# Patient Record
Sex: Female | Born: 1965
Health system: Southern US, Community
[De-identification: ages and names within clinical notes are randomized; demographics above are authoritative.]

## PROBLEM LIST (undated history)

## (undated) DIAGNOSIS — B20 Human immunodeficiency virus [HIV] disease: Secondary | ICD-10-CM

## (undated) DIAGNOSIS — Z21 Asymptomatic human immunodeficiency virus [HIV] infection status: Secondary | ICD-10-CM

## (undated) HISTORY — DX: Asymptomatic human immunodeficiency virus (hiv) infection status: Z21

## (undated) HISTORY — DX: Human immunodeficiency virus (HIV) disease: B20

---

## 2009-11-04 ENCOUNTER — Ambulatory Visit: Payer: Self-pay | Admitting: Infectious Diseases

## 2010-02-17 ENCOUNTER — Ambulatory Visit: Payer: Self-pay | Admitting: Infectious Diseases

## 2010-04-21 ENCOUNTER — Ambulatory Visit: Payer: Self-pay | Admitting: Infectious Diseases

## 2010-07-07 ENCOUNTER — Ambulatory Visit: Payer: Self-pay | Admitting: Infectious Disease

## 2012-06-20 DIAGNOSIS — B20 Human immunodeficiency virus [HIV] disease: Secondary | ICD-10-CM

## 2012-09-11 DIAGNOSIS — B20 Human immunodeficiency virus [HIV] disease: Secondary | ICD-10-CM

## 2012-12-19 DIAGNOSIS — B20 Human immunodeficiency virus [HIV] disease: Secondary | ICD-10-CM

## 2013-05-12 DIAGNOSIS — B20 Human immunodeficiency virus [HIV] disease: Secondary | ICD-10-CM

## 2013-06-17 DIAGNOSIS — B20 Human immunodeficiency virus [HIV] disease: Secondary | ICD-10-CM

## 2014-10-14 DIAGNOSIS — B20 Human immunodeficiency virus [HIV] disease: Secondary | ICD-10-CM

## 2014-11-06 DIAGNOSIS — B20 Human immunodeficiency virus [HIV] disease: Secondary | ICD-10-CM

## 2014-12-17 DIAGNOSIS — J449 Chronic obstructive pulmonary disease, unspecified: Secondary | ICD-10-CM | POA: Diagnosis not present

## 2014-12-23 DIAGNOSIS — B20 Human immunodeficiency virus [HIV] disease: Secondary | ICD-10-CM | POA: Diagnosis not present

## 2014-12-24 DIAGNOSIS — R52 Pain, unspecified: Secondary | ICD-10-CM | POA: Diagnosis not present

## 2014-12-24 DIAGNOSIS — M545 Low back pain: Secondary | ICD-10-CM | POA: Diagnosis not present

## 2014-12-28 ENCOUNTER — Telehealth: Payer: Self-pay | Admitting: *Deleted

## 2014-12-28 NOTE — Telephone Encounter (Signed)
Attempt to call patient to have her come in for Hep C labs; voice mail not set up. Otila Kluver at [facility] notified. Myrtis Hopping

## 2015-02-04 DIAGNOSIS — B20 Human immunodeficiency virus [HIV] disease: Secondary | ICD-10-CM | POA: Diagnosis not present

## 2015-03-19 DIAGNOSIS — B20 Human immunodeficiency virus [HIV] disease: Secondary | ICD-10-CM | POA: Diagnosis not present

## 2015-05-10 DIAGNOSIS — M5416 Radiculopathy, lumbar region: Secondary | ICD-10-CM | POA: Diagnosis not present

## 2015-05-28 ENCOUNTER — Other Ambulatory Visit: Payer: Self-pay | Admitting: Infectious Disease

## 2015-06-16 DIAGNOSIS — B20 Human immunodeficiency virus [HIV] disease: Secondary | ICD-10-CM | POA: Diagnosis not present

## 2015-06-23 ENCOUNTER — Other Ambulatory Visit: Payer: Self-pay | Admitting: Infectious Disease

## 2015-08-05 ENCOUNTER — Other Ambulatory Visit: Payer: Self-pay | Admitting: Pharmacist Clinician (PhC)/ Clinical Pharmacy Specialist

## 2015-08-05 MED ORDER — LEDIPASVIR-SOFOSBUVIR 90-400 MG PO TABS: 1.0000 | ORAL_TABLET | Freq: Every day | ORAL | Status: DC

## 2015-08-05 MED ORDER — RIBAVIRIN 200 MG PO CAPS: 600.0000 mg | ORAL_CAPSULE | Freq: Every day | ORAL | Status: DC

## 2015-08-06 DIAGNOSIS — B20 Human immunodeficiency virus [HIV] disease: Secondary | ICD-10-CM | POA: Diagnosis not present

## 2015-08-12 ENCOUNTER — Encounter: Payer: Self-pay | Admitting: Pharmacy Technician

## 2015-09-09 ENCOUNTER — Other Ambulatory Visit: Payer: Self-pay | Admitting: Pharmacist Clinician (PhC)/ Clinical Pharmacy Specialist

## 2015-09-09 MED ORDER — ONDANSETRON HCL 4 MG PO TABS: 4.0000 mg | ORAL_TABLET | Freq: Three times a day (TID) | ORAL | Status: DC | PRN

## 2015-09-09 NOTE — Progress Notes (Signed)
Sounds good thanks [place]!

## 2015-09-09 NOTE — Progress Notes (Signed)
Christine Stewart called about refill for one of her meds which I think it's the ribavirin portion of her hepatitis C regimen since we are giving it to her for free. She also complained of N/V which could be due to the ribavirin. She has not missed doses. Since she has phenergan on board, I'm going to add PRN zofran to it and told her how to take it.   Zofran 4mg  PO q8h PRN

## 2015-09-21 DIAGNOSIS — B20 Human immunodeficiency virus [HIV] disease: Secondary | ICD-10-CM | POA: Diagnosis not present

## 2015-10-05 MED FILL — *HARVONI 90-400 MG TABLET: 90-400 | 28 days supply | Qty: 28 | Fill #2

## 2015-10-08 MED FILL — RIBAVIRIN 200 MG CAPSULE: 200 | 28 days supply | Qty: 84 | Fill #2

## 2015-11-10 DIAGNOSIS — B20 Human immunodeficiency virus [HIV] disease: Secondary | ICD-10-CM | POA: Diagnosis not present

## 2016-01-14 DIAGNOSIS — B20 Human immunodeficiency virus [HIV] disease: Secondary | ICD-10-CM | POA: Diagnosis not present

## 2016-03-29 ENCOUNTER — Other Ambulatory Visit: Payer: Self-pay | Admitting: Sports Medicine

## 2016-03-29 DIAGNOSIS — M5136 Other intervertebral disc degeneration, lumbar region: Secondary | ICD-10-CM | POA: Diagnosis not present

## 2016-03-29 DIAGNOSIS — M5410 Radiculopathy, site unspecified: Secondary | ICD-10-CM

## 2016-03-29 DIAGNOSIS — M25551 Pain in right hip: Secondary | ICD-10-CM | POA: Diagnosis not present

## 2016-03-29 DIAGNOSIS — M545 Low back pain: Secondary | ICD-10-CM

## 2016-04-06 ENCOUNTER — Inpatient Hospital Stay: Admission: RE | Admit: 2016-04-06 | Payer: Medicare Other | Source: Ambulatory Visit

## 2016-04-06 DIAGNOSIS — M545 Low back pain: Secondary | ICD-10-CM | POA: Diagnosis not present

## 2016-04-06 DIAGNOSIS — M419 Scoliosis, unspecified: Secondary | ICD-10-CM | POA: Diagnosis not present

## 2016-04-06 DIAGNOSIS — M791 Myalgia: Secondary | ICD-10-CM | POA: Diagnosis not present

## 2016-04-06 DIAGNOSIS — M47896 Other spondylosis, lumbar region: Secondary | ICD-10-CM | POA: Diagnosis not present

## 2016-04-06 DIAGNOSIS — M549 Dorsalgia, unspecified: Secondary | ICD-10-CM | POA: Diagnosis not present

## 2016-04-07 ENCOUNTER — Other Ambulatory Visit (HOSPITAL_BASED_OUTPATIENT_CLINIC_OR_DEPARTMENT_OTHER): Payer: Self-pay | Admitting: Orthopedic Surgery

## 2016-04-07 DIAGNOSIS — M545 Low back pain: Secondary | ICD-10-CM

## 2016-04-07 DIAGNOSIS — M47896 Other spondylosis, lumbar region: Secondary | ICD-10-CM

## 2016-04-16 ENCOUNTER — Other Ambulatory Visit (HOSPITAL_BASED_OUTPATIENT_CLINIC_OR_DEPARTMENT_OTHER): Payer: Medicare Other

## 2016-04-16 ENCOUNTER — Ambulatory Visit (HOSPITAL_BASED_OUTPATIENT_CLINIC_OR_DEPARTMENT_OTHER)
Admission: RE | Admit: 2016-04-16 | Discharge: 2016-04-16 | Disposition: A | Discharge status: Home / Self Care | Payer: Medicare Other | Source: Ambulatory Visit | Attending: Orthopedic Surgery | Admitting: Orthopedic Surgery

## 2016-04-16 DIAGNOSIS — M5126 Other intervertebral disc displacement, lumbar region: Secondary | ICD-10-CM | POA: Insufficient documentation

## 2016-04-16 DIAGNOSIS — M545 Low back pain: Secondary | ICD-10-CM | POA: Diagnosis present

## 2016-04-16 DIAGNOSIS — M47896 Other spondylosis, lumbar region: Secondary | ICD-10-CM

## 2016-04-20 DIAGNOSIS — M47816 Spondylosis without myelopathy or radiculopathy, lumbar region: Secondary | ICD-10-CM | POA: Diagnosis not present

## 2016-04-20 DIAGNOSIS — M549 Dorsalgia, unspecified: Secondary | ICD-10-CM | POA: Diagnosis not present

## 2016-04-20 DIAGNOSIS — M47896 Other spondylosis, lumbar region: Secondary | ICD-10-CM | POA: Diagnosis not present

## 2016-05-24 DIAGNOSIS — F331 Major depressive disorder, recurrent, moderate: Secondary | ICD-10-CM | POA: Diagnosis not present

## 2016-05-24 DIAGNOSIS — F411 Generalized anxiety disorder: Secondary | ICD-10-CM | POA: Diagnosis not present

## 2016-06-07 DIAGNOSIS — F411 Generalized anxiety disorder: Secondary | ICD-10-CM | POA: Diagnosis not present

## 2016-06-07 DIAGNOSIS — F331 Major depressive disorder, recurrent, moderate: Secondary | ICD-10-CM | POA: Diagnosis not present

## 2016-06-27 DIAGNOSIS — F331 Major depressive disorder, recurrent, moderate: Secondary | ICD-10-CM | POA: Diagnosis not present

## 2016-06-27 DIAGNOSIS — B20 Human immunodeficiency virus [HIV] disease: Secondary | ICD-10-CM | POA: Diagnosis not present

## 2016-06-27 DIAGNOSIS — F6381 Intermittent explosive disorder: Secondary | ICD-10-CM | POA: Diagnosis not present

## 2016-06-27 DIAGNOSIS — Z79899 Other long term (current) drug therapy: Secondary | ICD-10-CM | POA: Diagnosis not present

## 2016-07-11 DIAGNOSIS — F330 Major depressive disorder, recurrent, mild: Secondary | ICD-10-CM | POA: Diagnosis not present

## 2016-08-01 DIAGNOSIS — N898 Other specified noninflammatory disorders of vagina: Secondary | ICD-10-CM | POA: Diagnosis not present

## 2016-08-01 DIAGNOSIS — R102 Pelvic and perineal pain: Secondary | ICD-10-CM | POA: Diagnosis not present

## 2016-08-01 DIAGNOSIS — R87810 Cervical high risk human papillomavirus (HPV) DNA test positive: Secondary | ICD-10-CM | POA: Diagnosis not present

## 2016-08-03 DIAGNOSIS — F418 Other specified anxiety disorders: Secondary | ICD-10-CM | POA: Diagnosis not present

## 2016-08-03 DIAGNOSIS — B20 Human immunodeficiency virus [HIV] disease: Secondary | ICD-10-CM | POA: Diagnosis not present

## 2016-08-03 DIAGNOSIS — B182 Chronic viral hepatitis C: Secondary | ICD-10-CM | POA: Diagnosis not present

## 2016-08-03 DIAGNOSIS — Z79899 Other long term (current) drug therapy: Secondary | ICD-10-CM | POA: Diagnosis not present

## 2016-08-03 DIAGNOSIS — Z23 Encounter for immunization: Secondary | ICD-10-CM | POA: Diagnosis not present

## 2016-08-03 DIAGNOSIS — R112 Nausea with vomiting, unspecified: Secondary | ICD-10-CM | POA: Diagnosis not present

## 2016-08-03 DIAGNOSIS — F17210 Nicotine dependence, cigarettes, uncomplicated: Secondary | ICD-10-CM | POA: Diagnosis not present

## 2016-08-03 DIAGNOSIS — I10 Essential (primary) hypertension: Secondary | ICD-10-CM | POA: Diagnosis not present

## 2016-08-09 DIAGNOSIS — R1030 Lower abdominal pain, unspecified: Secondary | ICD-10-CM | POA: Diagnosis not present

## 2016-08-09 DIAGNOSIS — R102 Pelvic and perineal pain: Secondary | ICD-10-CM | POA: Diagnosis not present

## 2016-08-11 DIAGNOSIS — B20 Human immunodeficiency virus [HIV] disease: Secondary | ICD-10-CM | POA: Diagnosis not present

## 2016-08-16 DIAGNOSIS — F411 Generalized anxiety disorder: Secondary | ICD-10-CM | POA: Diagnosis not present

## 2016-08-16 DIAGNOSIS — F331 Major depressive disorder, recurrent, moderate: Secondary | ICD-10-CM | POA: Diagnosis not present

## 2016-08-21 DIAGNOSIS — R102 Pelvic and perineal pain: Secondary | ICD-10-CM | POA: Diagnosis not present

## 2016-08-21 DIAGNOSIS — N719 Inflammatory disease of uterus, unspecified: Secondary | ICD-10-CM | POA: Diagnosis not present

## 2016-08-21 DIAGNOSIS — B9689 Other specified bacterial agents as the cause of diseases classified elsewhere: Secondary | ICD-10-CM | POA: Diagnosis not present

## 2016-08-21 DIAGNOSIS — N760 Acute vaginitis: Secondary | ICD-10-CM | POA: Diagnosis not present

## 2016-09-12 DIAGNOSIS — N72 Inflammatory disease of cervix uteri: Secondary | ICD-10-CM | POA: Diagnosis not present

## 2016-09-12 DIAGNOSIS — Z3202 Encounter for pregnancy test, result negative: Secondary | ICD-10-CM | POA: Diagnosis not present

## 2016-09-12 DIAGNOSIS — B977 Papillomavirus as the cause of diseases classified elsewhere: Secondary | ICD-10-CM | POA: Diagnosis not present

## 2016-09-14 DIAGNOSIS — F330 Major depressive disorder, recurrent, mild: Secondary | ICD-10-CM | POA: Diagnosis not present

## 2016-09-14 DIAGNOSIS — F411 Generalized anxiety disorder: Secondary | ICD-10-CM | POA: Diagnosis not present

## 2016-10-24 DIAGNOSIS — Z79899 Other long term (current) drug therapy: Secondary | ICD-10-CM | POA: Diagnosis not present

## 2016-10-24 DIAGNOSIS — B20 Human immunodeficiency virus [HIV] disease: Secondary | ICD-10-CM | POA: Diagnosis not present

## 2016-10-24 DIAGNOSIS — F330 Major depressive disorder, recurrent, mild: Secondary | ICD-10-CM | POA: Diagnosis not present

## 2016-10-24 DIAGNOSIS — B182 Chronic viral hepatitis C: Secondary | ICD-10-CM | POA: Diagnosis not present

## 2017-04-06 DIAGNOSIS — B182 Chronic viral hepatitis C: Secondary | ICD-10-CM | POA: Diagnosis not present

## 2017-04-06 DIAGNOSIS — Z79899 Other long term (current) drug therapy: Secondary | ICD-10-CM | POA: Diagnosis not present

## 2017-04-06 DIAGNOSIS — B20 Human immunodeficiency virus [HIV] disease: Secondary | ICD-10-CM | POA: Diagnosis not present

## 2017-07-05 DIAGNOSIS — D2372 Other benign neoplasm of skin of left lower limb, including hip: Secondary | ICD-10-CM | POA: Diagnosis not present

## 2017-07-05 DIAGNOSIS — N200 Calculus of kidney: Secondary | ICD-10-CM | POA: Diagnosis not present

## 2017-07-05 DIAGNOSIS — B20 Human immunodeficiency virus [HIV] disease: Secondary | ICD-10-CM | POA: Diagnosis not present

## 2017-07-05 DIAGNOSIS — F17200 Nicotine dependence, unspecified, uncomplicated: Secondary | ICD-10-CM | POA: Diagnosis not present

## 2017-07-05 DIAGNOSIS — Z8619 Personal history of other infectious and parasitic diseases: Secondary | ICD-10-CM | POA: Diagnosis not present

## 2017-07-05 DIAGNOSIS — I10 Essential (primary) hypertension: Secondary | ICD-10-CM | POA: Diagnosis not present

## 2017-07-05 DIAGNOSIS — Z23 Encounter for immunization: Secondary | ICD-10-CM | POA: Diagnosis not present

## 2017-07-05 DIAGNOSIS — F329 Major depressive disorder, single episode, unspecified: Secondary | ICD-10-CM | POA: Diagnosis not present

## 2017-07-19 DIAGNOSIS — L723 Sebaceous cyst: Secondary | ICD-10-CM | POA: Diagnosis not present

## 2017-07-23 DIAGNOSIS — Z8619 Personal history of other infectious and parasitic diseases: Secondary | ICD-10-CM | POA: Diagnosis not present

## 2017-07-23 DIAGNOSIS — L720 Epidermal cyst: Secondary | ICD-10-CM | POA: Diagnosis not present

## 2017-07-23 DIAGNOSIS — L723 Sebaceous cyst: Secondary | ICD-10-CM | POA: Diagnosis not present

## 2017-07-23 DIAGNOSIS — F17200 Nicotine dependence, unspecified, uncomplicated: Secondary | ICD-10-CM | POA: Diagnosis not present

## 2017-07-23 DIAGNOSIS — Z79899 Other long term (current) drug therapy: Secondary | ICD-10-CM | POA: Diagnosis not present

## 2017-10-18 DIAGNOSIS — B20 Human immunodeficiency virus [HIV] disease: Secondary | ICD-10-CM | POA: Diagnosis not present

## 2017-11-15 DIAGNOSIS — R112 Nausea with vomiting, unspecified: Secondary | ICD-10-CM | POA: Diagnosis not present

## 2017-11-15 DIAGNOSIS — J449 Chronic obstructive pulmonary disease, unspecified: Secondary | ICD-10-CM | POA: Diagnosis not present

## 2017-11-15 DIAGNOSIS — R1013 Epigastric pain: Secondary | ICD-10-CM | POA: Diagnosis not present

## 2017-11-15 DIAGNOSIS — R1012 Left upper quadrant pain: Secondary | ICD-10-CM | POA: Diagnosis not present

## 2017-11-15 DIAGNOSIS — F17210 Nicotine dependence, cigarettes, uncomplicated: Secondary | ICD-10-CM | POA: Diagnosis not present

## 2017-11-21 DIAGNOSIS — K219 Gastro-esophageal reflux disease without esophagitis: Secondary | ICD-10-CM | POA: Diagnosis not present

## 2017-11-21 DIAGNOSIS — R1011 Right upper quadrant pain: Secondary | ICD-10-CM | POA: Diagnosis not present

## 2017-11-21 DIAGNOSIS — R1013 Epigastric pain: Secondary | ICD-10-CM | POA: Diagnosis not present

## 2017-11-22 DIAGNOSIS — R110 Nausea: Secondary | ICD-10-CM | POA: Diagnosis not present

## 2017-11-22 DIAGNOSIS — Z79899 Other long term (current) drug therapy: Secondary | ICD-10-CM | POA: Diagnosis not present

## 2017-11-22 DIAGNOSIS — Z8619 Personal history of other infectious and parasitic diseases: Secondary | ICD-10-CM | POA: Diagnosis not present

## 2017-11-22 DIAGNOSIS — F17210 Nicotine dependence, cigarettes, uncomplicated: Secondary | ICD-10-CM | POA: Diagnosis not present

## 2017-11-22 DIAGNOSIS — Z21 Asymptomatic human immunodeficiency virus [HIV] infection status: Secondary | ICD-10-CM | POA: Diagnosis not present

## 2017-11-22 DIAGNOSIS — R1013 Epigastric pain: Secondary | ICD-10-CM | POA: Diagnosis not present

## 2017-11-22 DIAGNOSIS — K449 Diaphragmatic hernia without obstruction or gangrene: Secondary | ICD-10-CM | POA: Diagnosis not present

## 2017-11-28 DIAGNOSIS — R110 Nausea: Secondary | ICD-10-CM | POA: Diagnosis not present

## 2017-11-28 DIAGNOSIS — K7689 Other specified diseases of liver: Secondary | ICD-10-CM | POA: Diagnosis not present

## 2017-11-28 DIAGNOSIS — R1013 Epigastric pain: Secondary | ICD-10-CM | POA: Diagnosis not present

## 2018-03-06 IMAGING — MR MR LUMBAR SPINE W/O CM
5 series · 34 of 48 positions shown · non-contrast
Comparison: Radiography 03/19/2014

CLINICAL DATA: Low back pain, several years duration, worsening
recently. Pain radiates to buttocks and hips with right worse than
left. Some numbness and tingling of the feet.

EXAM:
MRI LUMBAR SPINE WITHOUT CONTRAST
TECHNIQUE: Multiplanar, multisequence MR imaging of the lumbar spine was
performed. No intravenous contrast was administered.

[Series 2: T1 · sagittal · 4.0mm · 0.51mm/px · 6 of 15 slices shown (1 of 2)]
[im 1/15]
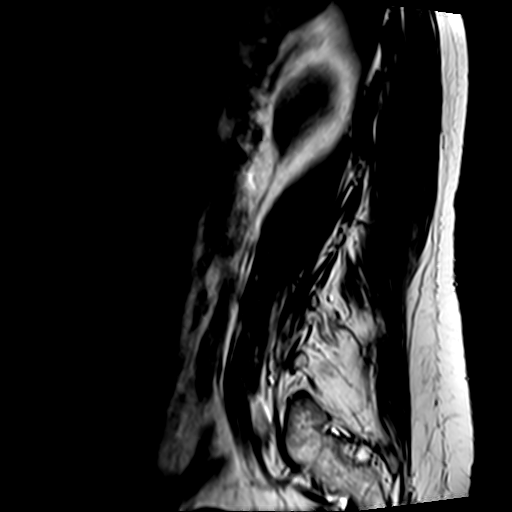
[im 3/15]
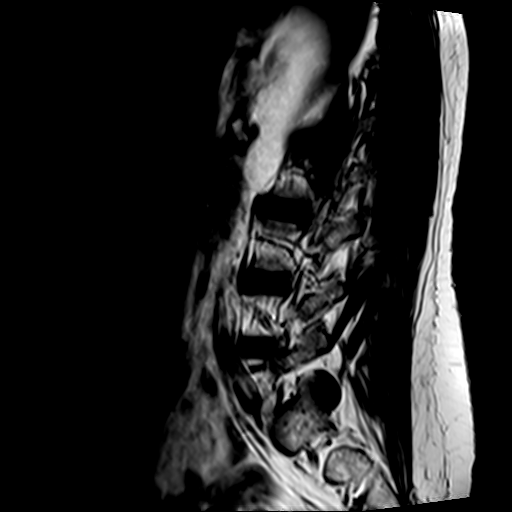
[im 6/15]
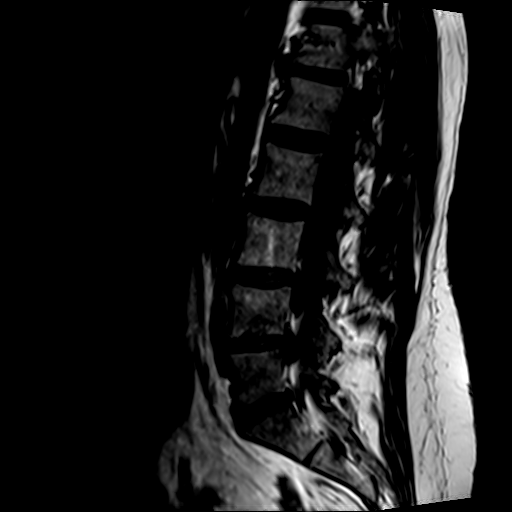
[im 9/15]
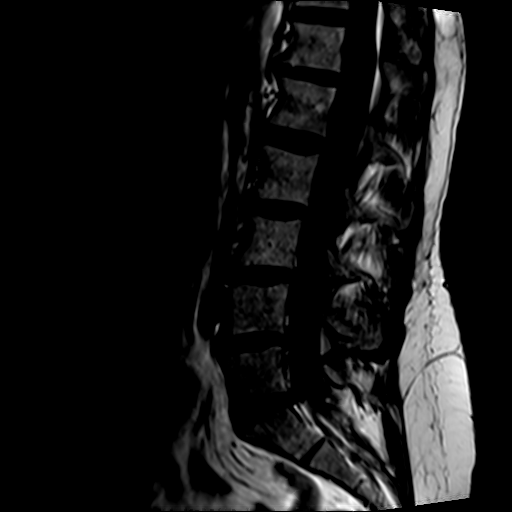
[im 12/15]
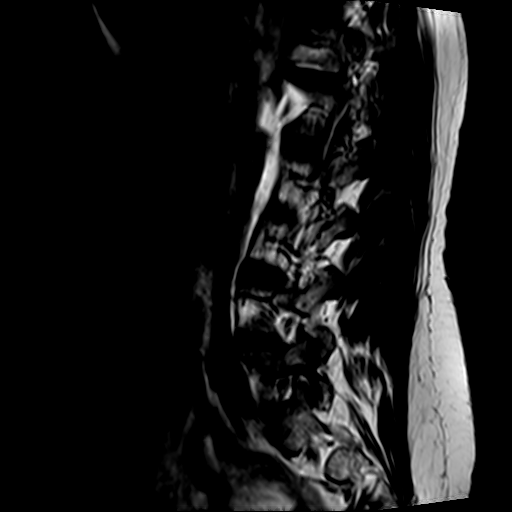
[im 15/15]
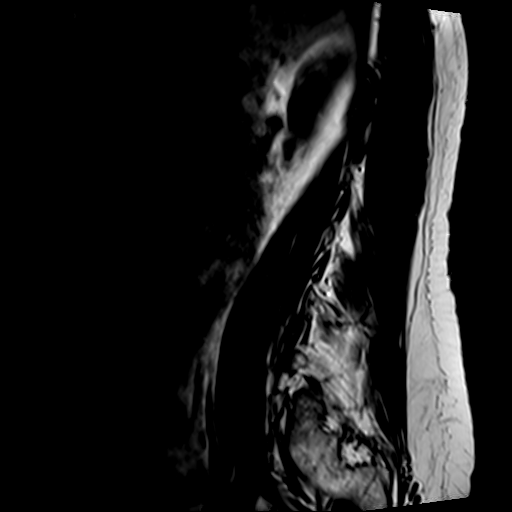

[Series 3: T2 · sagittal · 4.0mm · 0.81mm/px · 6 of 15 slices shown (1 of 2)]
[im 1/15]
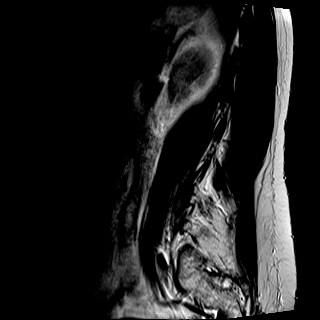
[im 3/15]
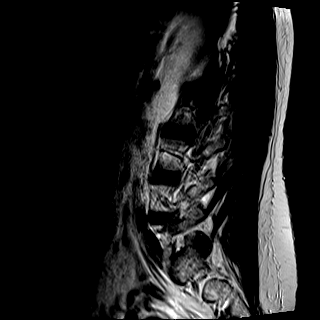
[im 6/15]
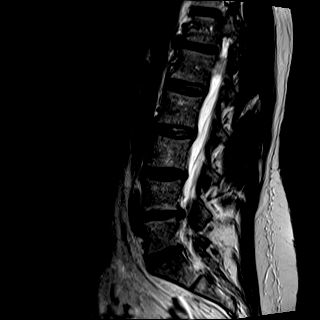
[im 9/15]
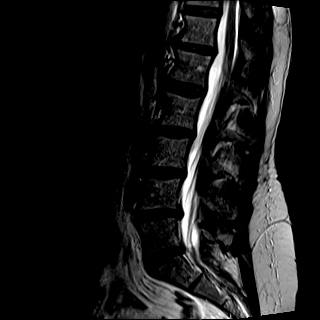
[im 12/15]
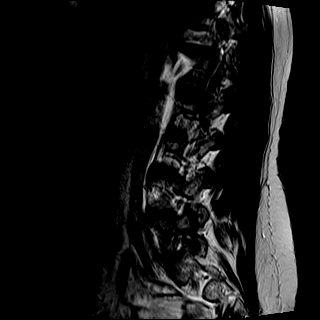
[im 15/15]
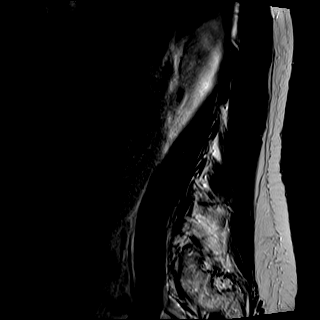

[Series 4: STIR · sagittal · 4.0mm · 1.02mm/px · 4 of 15 slices shown]
[im 1/15]
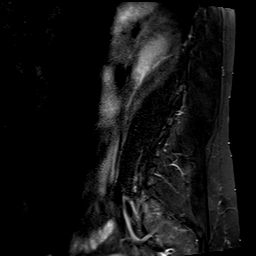
[im 3/15]
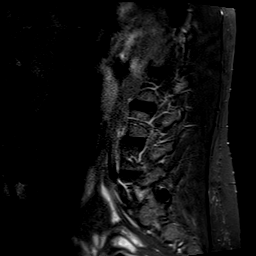
[im 6/15]
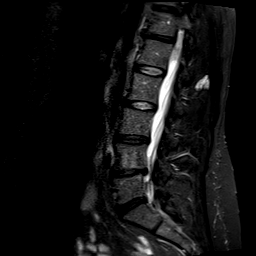
[im 9/15]
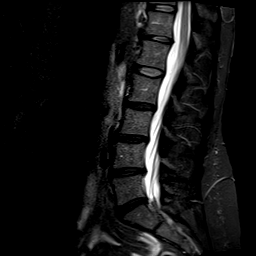

[Series 5: T2 · axial · 4.0mm · 0.39mm/px · z∈[-115,+103]mm · 9 of 38 slices shown (2 of 2)]
[im 1/38]
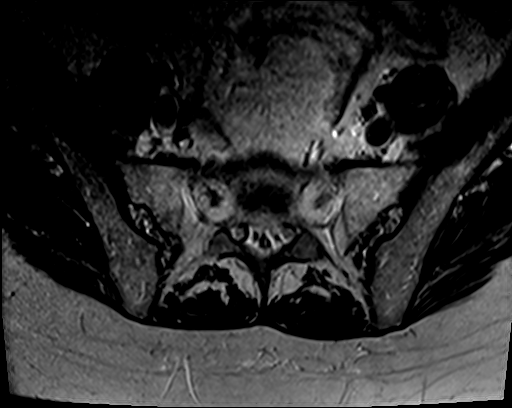
[im 6/38]
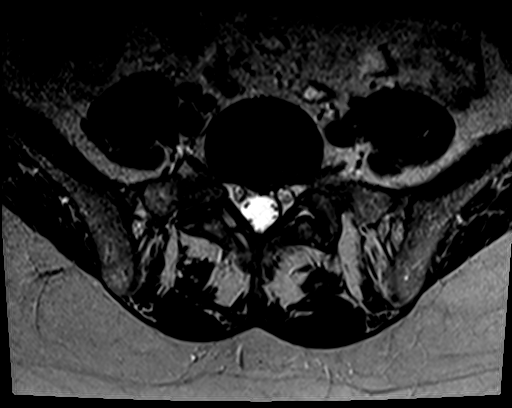
[im 11/38]
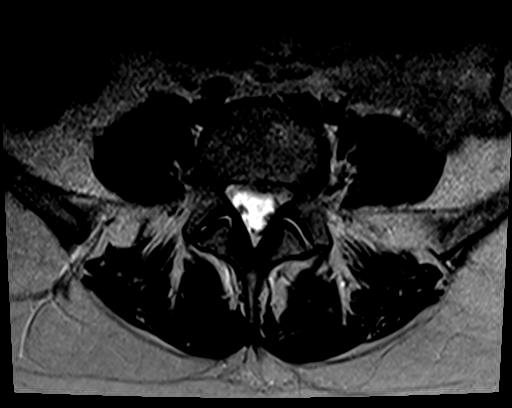
[im 16/38]
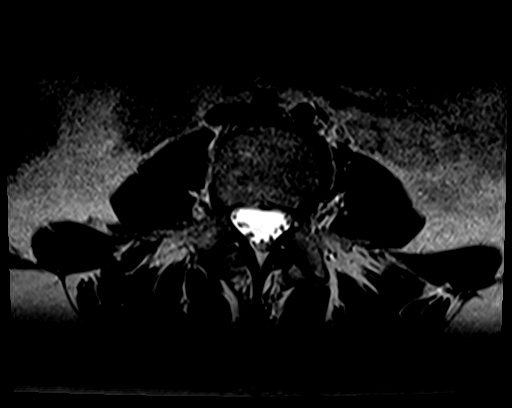
[im 19/38]
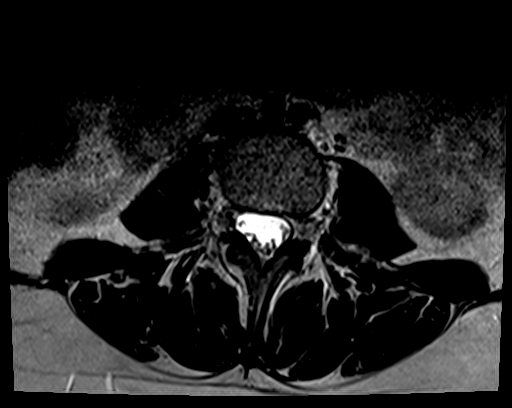
[im 22/38]
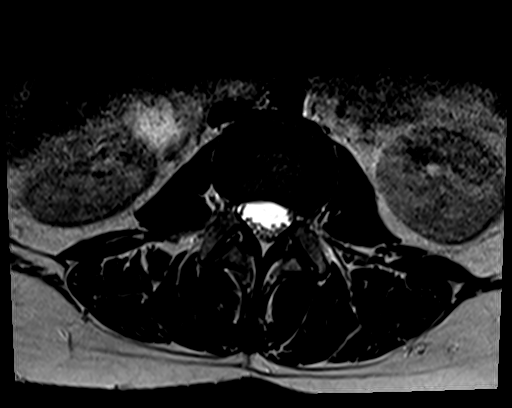
[im 27/38]
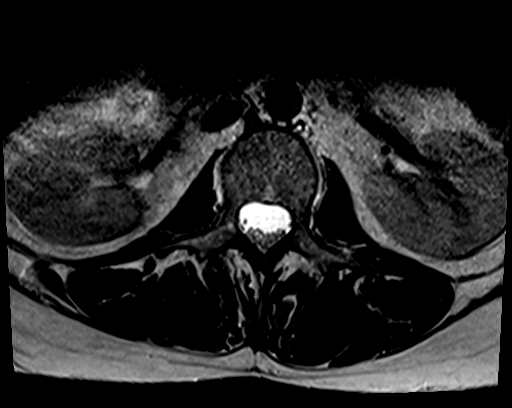
[im 32/38]
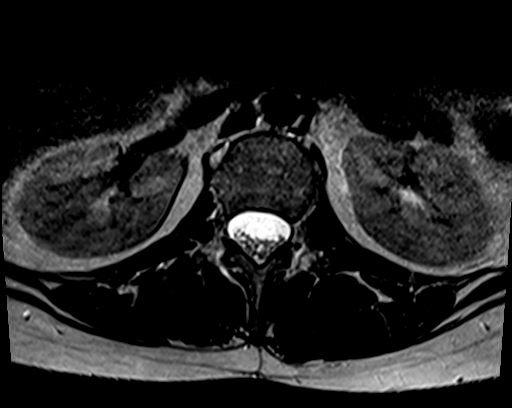
[im 38/38]
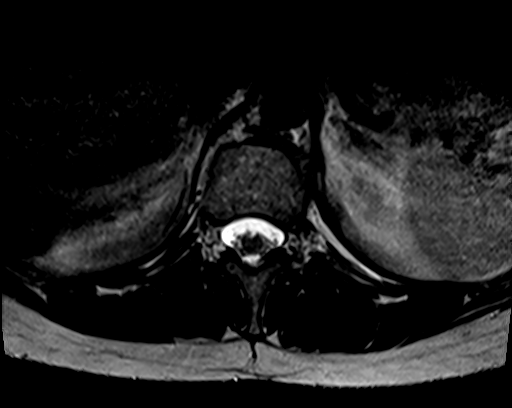

[Series 6: T1 · axial · 4.0mm · 0.78mm/px · z∈[-115,+103]mm · 9 of 38 slices shown (2 of 2)]
[im 1/38]
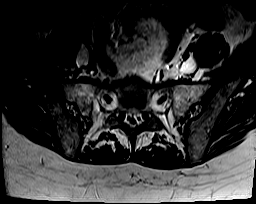
[im 6/38]
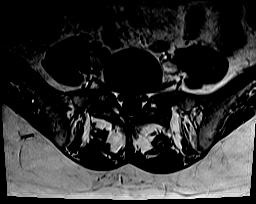
[im 11/38]
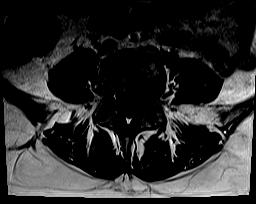
[im 16/38]
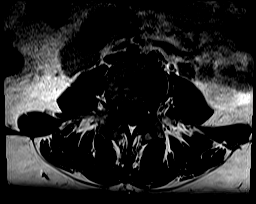
[im 19/38]
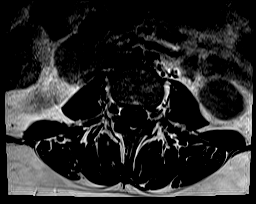
[im 22/38]
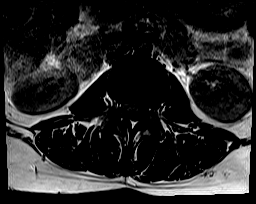
[im 27/38]
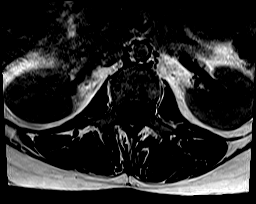
[im 32/38]
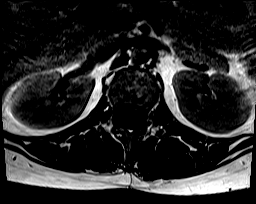
[im 38/38]
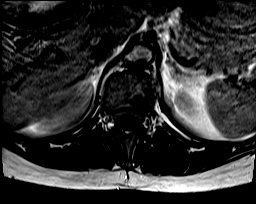

[34 of 48 positions shown; findings below may reference images not displayed]

FINDINGS: Segmentation:  5 lumbar type vertebral bodies.

Alignment:  Normal

Vertebrae:  No fracture or focal lesion.

Conus medullaris: Extends to the L1 level and appears normal.

Paraspinal and other soft tissues: Normal

Disc levels:

Minimal non-compressive disc bulge at T12-L1.

L1-2:  Normal.

L2-3:  Minimal noncompressive disc bulge.

L3-4:  Mild non-compressive disc bulge.

L4-5: Moderate disc bulge slightly more prominent towards the left.
Mild narrowing of the left lateral recess but without visible neural
compression. Mild discogenic marrow edema at the endplates could be
associated with back pain.

L5-S1:  Minimal noncompressive disc bulge.

No significant facet arthropathy.
IMPRESSION: Moderate disc bulge at L4-5 slightly more prominent towards the
left. Mild narrowing of the left lateral recess but no visible
neural compression. Mild discogenic edema of the endplates could be
associated with back pain.

Mild non-compressive disc bulges at L2-3, L3-4 and L5-S1.

## 2018-04-25 DIAGNOSIS — F418 Other specified anxiety disorders: Secondary | ICD-10-CM | POA: Diagnosis not present

## 2018-04-25 DIAGNOSIS — Z9114 Patient's other noncompliance with medication regimen: Secondary | ICD-10-CM | POA: Diagnosis not present

## 2018-04-25 DIAGNOSIS — Z8619 Personal history of other infectious and parasitic diseases: Secondary | ICD-10-CM | POA: Diagnosis not present

## 2018-04-25 DIAGNOSIS — I10 Essential (primary) hypertension: Secondary | ICD-10-CM | POA: Diagnosis not present

## 2018-04-25 DIAGNOSIS — K219 Gastro-esophageal reflux disease without esophagitis: Secondary | ICD-10-CM | POA: Diagnosis not present

## 2018-04-25 DIAGNOSIS — F17210 Nicotine dependence, cigarettes, uncomplicated: Secondary | ICD-10-CM | POA: Diagnosis not present

## 2018-04-25 DIAGNOSIS — B20 Human immunodeficiency virus [HIV] disease: Secondary | ICD-10-CM | POA: Diagnosis not present

## 2018-06-20 DIAGNOSIS — Z23 Encounter for immunization: Secondary | ICD-10-CM | POA: Diagnosis not present

## 2018-06-20 DIAGNOSIS — F418 Other specified anxiety disorders: Secondary | ICD-10-CM | POA: Diagnosis not present

## 2018-06-20 DIAGNOSIS — B20 Human immunodeficiency virus [HIV] disease: Secondary | ICD-10-CM | POA: Diagnosis not present

## 2018-06-20 DIAGNOSIS — K219 Gastro-esophageal reflux disease without esophagitis: Secondary | ICD-10-CM | POA: Diagnosis not present

## 2018-06-20 DIAGNOSIS — B182 Chronic viral hepatitis C: Secondary | ICD-10-CM | POA: Diagnosis not present

## 2018-06-20 DIAGNOSIS — I10 Essential (primary) hypertension: Secondary | ICD-10-CM | POA: Diagnosis not present

## 2018-06-20 DIAGNOSIS — F17210 Nicotine dependence, cigarettes, uncomplicated: Secondary | ICD-10-CM | POA: Diagnosis not present

## 2018-07-13 DIAGNOSIS — Z792 Long term (current) use of antibiotics: Secondary | ICD-10-CM | POA: Diagnosis not present

## 2018-07-13 DIAGNOSIS — Z79899 Other long term (current) drug therapy: Secondary | ICD-10-CM | POA: Diagnosis not present

## 2018-07-13 DIAGNOSIS — S52122A Displaced fracture of head of left radius, initial encounter for closed fracture: Secondary | ICD-10-CM | POA: Diagnosis not present

## 2018-07-16 DIAGNOSIS — S53105A Unspecified dislocation of left ulnohumeral joint, initial encounter: Secondary | ICD-10-CM | POA: Diagnosis not present

## 2018-07-16 DIAGNOSIS — S52122A Displaced fracture of head of left radius, initial encounter for closed fracture: Secondary | ICD-10-CM | POA: Diagnosis not present

## 2018-07-22 DIAGNOSIS — S52122A Displaced fracture of head of left radius, initial encounter for closed fracture: Secondary | ICD-10-CM | POA: Diagnosis not present

## 2018-07-24 DIAGNOSIS — S52122A Displaced fracture of head of left radius, initial encounter for closed fracture: Secondary | ICD-10-CM | POA: Diagnosis not present

## 2018-07-24 DIAGNOSIS — M25522 Pain in left elbow: Secondary | ICD-10-CM | POA: Diagnosis not present

## 2018-07-29 DIAGNOSIS — I10 Essential (primary) hypertension: Secondary | ICD-10-CM | POA: Diagnosis not present

## 2018-07-29 DIAGNOSIS — M84634A Pathological fracture in other disease, left radius, initial encounter for fracture: Secondary | ICD-10-CM | POA: Diagnosis not present

## 2018-07-29 DIAGNOSIS — B20 Human immunodeficiency virus [HIV] disease: Secondary | ICD-10-CM | POA: Diagnosis not present

## 2018-07-29 DIAGNOSIS — S53105A Unspecified dislocation of left ulnohumeral joint, initial encounter: Secondary | ICD-10-CM | POA: Diagnosis not present

## 2018-07-29 DIAGNOSIS — F17200 Nicotine dependence, unspecified, uncomplicated: Secondary | ICD-10-CM | POA: Diagnosis not present

## 2018-07-29 DIAGNOSIS — M19022 Primary osteoarthritis, left elbow: Secondary | ICD-10-CM | POA: Diagnosis not present

## 2018-07-29 DIAGNOSIS — Z79899 Other long term (current) drug therapy: Secondary | ICD-10-CM | POA: Diagnosis not present

## 2018-07-29 DIAGNOSIS — Z21 Asymptomatic human immunodeficiency virus [HIV] infection status: Secondary | ICD-10-CM | POA: Diagnosis not present

## 2018-07-29 DIAGNOSIS — G8918 Other acute postprocedural pain: Secondary | ICD-10-CM | POA: Diagnosis not present

## 2018-07-29 DIAGNOSIS — S52122A Displaced fracture of head of left radius, initial encounter for closed fracture: Secondary | ICD-10-CM | POA: Diagnosis not present

## 2018-07-29 DIAGNOSIS — S52132A Displaced fracture of neck of left radius, initial encounter for closed fracture: Secondary | ICD-10-CM | POA: Diagnosis not present

## 2018-08-01 DIAGNOSIS — Z79899 Other long term (current) drug therapy: Secondary | ICD-10-CM | POA: Diagnosis not present

## 2018-08-01 DIAGNOSIS — B20 Human immunodeficiency virus [HIV] disease: Secondary | ICD-10-CM | POA: Diagnosis not present

## 2018-08-01 DIAGNOSIS — M25422 Effusion, left elbow: Secondary | ICD-10-CM | POA: Diagnosis not present

## 2018-08-01 DIAGNOSIS — M25622 Stiffness of left elbow, not elsewhere classified: Secondary | ICD-10-CM | POA: Diagnosis not present

## 2018-08-01 DIAGNOSIS — F418 Other specified anxiety disorders: Secondary | ICD-10-CM | POA: Diagnosis not present

## 2018-08-01 DIAGNOSIS — M25522 Pain in left elbow: Secondary | ICD-10-CM | POA: Diagnosis not present

## 2018-08-08 DIAGNOSIS — S53105A Unspecified dislocation of left ulnohumeral joint, initial encounter: Secondary | ICD-10-CM | POA: Diagnosis not present

## 2018-08-08 DIAGNOSIS — S52122A Displaced fracture of head of left radius, initial encounter for closed fracture: Secondary | ICD-10-CM | POA: Diagnosis not present

## 2018-08-13 DIAGNOSIS — M25422 Effusion, left elbow: Secondary | ICD-10-CM | POA: Diagnosis not present

## 2018-08-13 DIAGNOSIS — M25522 Pain in left elbow: Secondary | ICD-10-CM | POA: Diagnosis not present

## 2018-08-13 DIAGNOSIS — M25622 Stiffness of left elbow, not elsewhere classified: Secondary | ICD-10-CM | POA: Diagnosis not present

## 2018-10-03 DIAGNOSIS — R05 Cough: Secondary | ICD-10-CM

## 2018-10-03 DIAGNOSIS — F17210 Nicotine dependence, cigarettes, uncomplicated: Secondary | ICD-10-CM | POA: Diagnosis not present

## 2018-10-03 DIAGNOSIS — Z720 Tobacco use: Secondary | ICD-10-CM

## 2018-10-03 DIAGNOSIS — Z8781 Personal history of (healed) traumatic fracture: Secondary | ICD-10-CM

## 2018-10-03 DIAGNOSIS — B182 Chronic viral hepatitis C: Secondary | ICD-10-CM

## 2018-10-03 DIAGNOSIS — B20 Human immunodeficiency virus [HIV] disease: Secondary | ICD-10-CM

## 2018-10-03 DIAGNOSIS — Z79899 Other long term (current) drug therapy: Secondary | ICD-10-CM

## 2018-11-04 DIAGNOSIS — S53105A Unspecified dislocation of left ulnohumeral joint, initial encounter: Secondary | ICD-10-CM | POA: Diagnosis not present

## 2018-11-04 DIAGNOSIS — S52122A Displaced fracture of head of left radius, initial encounter for closed fracture: Secondary | ICD-10-CM | POA: Diagnosis not present

## 2018-11-13 DIAGNOSIS — G5622 Lesion of ulnar nerve, left upper limb: Secondary | ICD-10-CM | POA: Diagnosis not present

## 2019-03-21 DIAGNOSIS — B20 Human immunodeficiency virus [HIV] disease: Secondary | ICD-10-CM | POA: Diagnosis not present

## 2019-04-01 DIAGNOSIS — Z20828 Contact with and (suspected) exposure to other viral communicable diseases: Secondary | ICD-10-CM | POA: Diagnosis not present

## 2019-04-17 ENCOUNTER — Encounter: Payer: Self-pay | Admitting: Internal Medicine

## 2019-04-17 DIAGNOSIS — F17210 Nicotine dependence, cigarettes, uncomplicated: Secondary | ICD-10-CM | POA: Diagnosis not present

## 2019-04-17 DIAGNOSIS — B182 Chronic viral hepatitis C: Secondary | ICD-10-CM | POA: Diagnosis not present

## 2019-04-17 DIAGNOSIS — K219 Gastro-esophageal reflux disease without esophagitis: Secondary | ICD-10-CM | POA: Diagnosis not present

## 2019-04-17 DIAGNOSIS — B20 Human immunodeficiency virus [HIV] disease: Secondary | ICD-10-CM | POA: Diagnosis not present

## 2019-04-17 DIAGNOSIS — F418 Other specified anxiety disorders: Secondary | ICD-10-CM | POA: Diagnosis not present

## 2019-08-01 DIAGNOSIS — Z79899 Other long term (current) drug therapy: Secondary | ICD-10-CM | POA: Diagnosis not present

## 2019-08-01 DIAGNOSIS — B20 Human immunodeficiency virus [HIV] disease: Secondary | ICD-10-CM | POA: Diagnosis not present

## 2019-08-05 DIAGNOSIS — S22080A Wedge compression fracture of T11-T12 vertebra, initial encounter for closed fracture: Secondary | ICD-10-CM | POA: Diagnosis not present

## 2019-08-07 DIAGNOSIS — M549 Dorsalgia, unspecified: Secondary | ICD-10-CM | POA: Diagnosis not present

## 2019-08-07 DIAGNOSIS — G8929 Other chronic pain: Secondary | ICD-10-CM | POA: Diagnosis not present

## 2019-08-07 DIAGNOSIS — B20 Human immunodeficiency virus [HIV] disease: Secondary | ICD-10-CM | POA: Diagnosis not present

## 2019-08-08 DIAGNOSIS — M545 Low back pain: Secondary | ICD-10-CM | POA: Diagnosis not present

## 2019-08-14 DIAGNOSIS — S22080A Wedge compression fracture of T11-T12 vertebra, initial encounter for closed fracture: Secondary | ICD-10-CM | POA: Diagnosis not present

## 2019-10-24 DIAGNOSIS — G894 Chronic pain syndrome: Secondary | ICD-10-CM | POA: Diagnosis not present

## 2019-10-24 DIAGNOSIS — M5416 Radiculopathy, lumbar region: Secondary | ICD-10-CM | POA: Diagnosis not present

## 2019-11-05 DIAGNOSIS — M5416 Radiculopathy, lumbar region: Secondary | ICD-10-CM | POA: Diagnosis not present

## 2019-12-04 DIAGNOSIS — G8929 Other chronic pain: Secondary | ICD-10-CM | POA: Diagnosis not present

## 2019-12-04 DIAGNOSIS — Z23 Encounter for immunization: Secondary | ICD-10-CM | POA: Diagnosis not present

## 2019-12-04 DIAGNOSIS — F418 Other specified anxiety disorders: Secondary | ICD-10-CM | POA: Diagnosis not present

## 2019-12-04 DIAGNOSIS — K219 Gastro-esophageal reflux disease without esophagitis: Secondary | ICD-10-CM | POA: Diagnosis not present

## 2019-12-04 DIAGNOSIS — F17210 Nicotine dependence, cigarettes, uncomplicated: Secondary | ICD-10-CM | POA: Diagnosis not present

## 2019-12-04 DIAGNOSIS — M549 Dorsalgia, unspecified: Secondary | ICD-10-CM | POA: Diagnosis not present

## 2019-12-04 DIAGNOSIS — B182 Chronic viral hepatitis C: Secondary | ICD-10-CM | POA: Diagnosis not present

## 2019-12-04 DIAGNOSIS — B20 Human immunodeficiency virus [HIV] disease: Secondary | ICD-10-CM | POA: Diagnosis not present

## 2019-12-23 ENCOUNTER — Encounter: Payer: Self-pay | Admitting: Internal Medicine

## 2019-12-23 NOTE — Progress Notes (Signed)
Patient ID: Christine Stewart, female   DOB: 19-Apr-1966, 54 y.o.   MRN: [id number] Patient of [facility] called to see if she is transferring to [facility] in [facility]   Left message to call back

## 2020-01-02 ENCOUNTER — Encounter: Payer: Self-pay | Admitting: Internal Medicine

## 2020-01-14 DIAGNOSIS — F17210 Nicotine dependence, cigarettes, uncomplicated: Secondary | ICD-10-CM | POA: Insufficient documentation

## 2020-01-14 DIAGNOSIS — K219 Gastro-esophageal reflux disease without esophagitis: Secondary | ICD-10-CM | POA: Insufficient documentation

## 2020-01-14 DIAGNOSIS — F418 Other specified anxiety disorders: Secondary | ICD-10-CM | POA: Insufficient documentation

## 2020-01-14 DIAGNOSIS — B20 Human immunodeficiency virus [HIV] disease: Secondary | ICD-10-CM | POA: Insufficient documentation

## 2020-01-14 DIAGNOSIS — B182 Chronic viral hepatitis C: Secondary | ICD-10-CM | POA: Insufficient documentation

## 2020-01-14 DIAGNOSIS — M549 Dorsalgia, unspecified: Secondary | ICD-10-CM | POA: Insufficient documentation

## 2020-01-14 DIAGNOSIS — G8929 Other chronic pain: Secondary | ICD-10-CM | POA: Insufficient documentation

## 2020-01-15 ENCOUNTER — Ambulatory Visit (INDEPENDENT_AMBULATORY_CARE_PROVIDER_SITE_OTHER): Payer: Medicare Other | Admitting: Internal Medicine

## 2020-01-15 ENCOUNTER — Encounter: Payer: Self-pay | Admitting: Internal Medicine

## 2020-01-15 ENCOUNTER — Other Ambulatory Visit: Payer: Self-pay

## 2020-01-15 DIAGNOSIS — B182 Chronic viral hepatitis C: Secondary | ICD-10-CM | POA: Diagnosis not present

## 2020-01-15 DIAGNOSIS — K219 Gastro-esophageal reflux disease without esophagitis: Secondary | ICD-10-CM | POA: Diagnosis not present

## 2020-01-15 DIAGNOSIS — F17210 Nicotine dependence, cigarettes, uncomplicated: Secondary | ICD-10-CM

## 2020-01-15 DIAGNOSIS — G8929 Other chronic pain: Secondary | ICD-10-CM

## 2020-01-15 DIAGNOSIS — B20 Human immunodeficiency virus [HIV] disease: Secondary | ICD-10-CM | POA: Diagnosis not present

## 2020-01-15 DIAGNOSIS — M5440 Lumbago with sciatica, unspecified side: Secondary | ICD-10-CM | POA: Diagnosis not present

## 2020-01-15 DIAGNOSIS — F418 Other specified anxiety disorders: Secondary | ICD-10-CM | POA: Diagnosis not present

## 2020-01-15 MED ORDER — BIKTARVY 50-200-25 MG PO TABS: ORAL_TABLET | ORAL | 11 refills | Status: DC

## 2020-01-15 NOTE — Progress Notes (Signed)
Virtual Visit via Telephone Note  I connected with Christine Stewart on 01/15/20 at 10:30 AM EDT by telephone and verified that I am speaking with the correct person using two identifiers.  Location: Patient: Home Provider: [facility]   I discussed the limitations, risks, security and privacy concerns of performing an evaluation and management service by telephone and the availability of in person appointments. I also discussed with the patient that there may be a patient responsible charge related to this service. The patient expressed understanding and agreed to proceed.   History of Present Illness: I called and spoke with Christine Stewart by phone today.  I reviewed her recent lab work done at our clinic in [place], [place].  She reiterated that she has not been missing doses of her Biktarvy.   Observations/Objective: CD4 count 12/04/2019 585 HIV viral load 12/04/2019 160  Assessment and Plan: Her viral load has bumped up slightly, possibly due to her recent steroid injection.  Follow Up Instructions: Continue Biktarvy Follow-up here in 6 months   I discussed the assessment and treatment plan with the patient. The patient was provided an opportunity to ask questions and all were answered. The patient agreed with the plan and demonstrated an understanding of the instructions.   The patient was advised to call back or seek an in-person evaluation if the symptoms worsen or if the condition fails to improve as anticipated.  I provided 13 minutes of non-face-to-face time during this encounter.   Michel Bickers, MD

## 2020-03-24 ENCOUNTER — Telehealth: Payer: Self-pay

## 2020-03-24 NOTE — Telephone Encounter (Signed)
Attempted to call patient back to encourage following up with PCP regarding her concerns. Unable to speak with her at this time. Left voicemail requesting a call back. [place]

## 2020-03-24 NOTE — Telephone Encounter (Signed)
Patient called office today to speak with Dr. Megan Salon regarding picc line and restarting medication for anxiety and depression. Patient states that during her vacation she was told she has kidney stones, would like to know if medication could be causing this. Would like to know if Dr. Megan Salon would be able to prescribe something to help with back pain.  Advised patient to go to urgent care regarding back pain. Also to locate a PCP for further assistance.  Patient also requesting medication for Anxiety/ Depression. States she was on Xanax 0.5 mg.  Aundria Rud, [place]

## 2020-03-24 NOTE — Telephone Encounter (Signed)
Please ask her why she thinks she needs a PICC line. Dr. Marco Collie is listed in epic as her PCP. Please let her know that I cannot manage her pain, anxiety or depression.

## 2020-05-26 DIAGNOSIS — Z1339 Encounter for screening examination for other mental health and behavioral disorders: Secondary | ICD-10-CM | POA: Diagnosis not present

## 2020-05-26 DIAGNOSIS — Z0000 Encounter for general adult medical examination without abnormal findings: Secondary | ICD-10-CM | POA: Diagnosis not present

## 2020-05-26 DIAGNOSIS — Z1331 Encounter for screening for depression: Secondary | ICD-10-CM | POA: Diagnosis not present

## 2020-05-26 DIAGNOSIS — F17210 Nicotine dependence, cigarettes, uncomplicated: Secondary | ICD-10-CM | POA: Diagnosis not present

## 2020-05-26 DIAGNOSIS — F329 Major depressive disorder, single episode, unspecified: Secondary | ICD-10-CM | POA: Diagnosis not present

## 2020-05-26 DIAGNOSIS — Z7689 Persons encountering health services in other specified circumstances: Secondary | ICD-10-CM | POA: Diagnosis not present

## 2020-05-26 DIAGNOSIS — F4310 Post-traumatic stress disorder, unspecified: Secondary | ICD-10-CM | POA: Diagnosis not present

## 2020-05-26 DIAGNOSIS — B20 Human immunodeficiency virus [HIV] disease: Secondary | ICD-10-CM | POA: Diagnosis not present

## 2020-05-26 DIAGNOSIS — Z139 Encounter for screening, unspecified: Secondary | ICD-10-CM | POA: Diagnosis not present

## 2020-07-13 ENCOUNTER — Ambulatory Visit: Payer: Self-pay | Admitting: Internal Medicine

## 2020-09-06 ENCOUNTER — Telehealth: Payer: Self-pay

## 2020-09-06 DIAGNOSIS — B20 Human immunodeficiency virus [HIV] disease: Secondary | ICD-10-CM

## 2020-09-06 MED ORDER — BIKTARVY 50-200-25 MG PO TABS: ORAL_TABLET | ORAL | 1 refills | Status: DC

## 2020-09-06 NOTE — Telephone Encounter (Signed)
Received call from [place] at [facility] requesting patient's [name] be sent there. Per Butch Penny, patient does have coverage through [facility]. RN called patient to schedule SPAP renewal with Timmothy Sours and follow-up visit with Dr. Johnda Billiot Salon. RN confirmed with patient she would like [place] sent to [facility]. RN will send.   Beryle Flock, RN

## 2020-09-08 ENCOUNTER — Ambulatory Visit: Payer: Self-pay

## 2020-09-14 ENCOUNTER — Ambulatory Visit: Payer: Self-pay | Admitting: Internal Medicine

## 2020-10-27 ENCOUNTER — Telehealth: Payer: Self-pay

## 2020-10-27 ENCOUNTER — Other Ambulatory Visit: Payer: Self-pay | Admitting: Internal Medicine

## 2020-10-27 DIAGNOSIS — B20 Human immunodeficiency virus [HIV] disease: Secondary | ICD-10-CM

## 2020-10-27 NOTE — Telephone Encounter (Signed)
Received refill request for patient's Biktarvy, patient is overdue for follow-up appointment. RN attempted to call patient to schedule appointment, no answer. Left HIPAA compliant voicemail requesting callback.   Beryle Flock, RN

## 2020-10-27 NOTE — Telephone Encounter (Signed)
Patient returned phone call, she was recently in contact with someone diagnosed with COVID and she is now feeling unwell. RN scheduled patient for labs and office visit in 2 weeks to give her time to recover.   Beryle Flock, RN

## 2020-11-11 ENCOUNTER — Other Ambulatory Visit: Payer: Self-pay

## 2020-11-11 DIAGNOSIS — B20 Human immunodeficiency virus [HIV] disease: Secondary | ICD-10-CM

## 2020-11-11 DIAGNOSIS — Z113 Encounter for screening for infections with a predominantly sexual mode of transmission: Secondary | ICD-10-CM

## 2020-11-11 DIAGNOSIS — Z79899 Other long term (current) drug therapy: Secondary | ICD-10-CM

## 2020-11-18 ENCOUNTER — Ambulatory Visit: Payer: Medicare HMO

## 2020-11-18 ENCOUNTER — Other Ambulatory Visit: Payer: Self-pay

## 2020-12-02 ENCOUNTER — Other Ambulatory Visit: Payer: Self-pay

## 2020-12-02 ENCOUNTER — Telehealth (INDEPENDENT_AMBULATORY_CARE_PROVIDER_SITE_OTHER): Payer: Medicare HMO | Admitting: Internal Medicine

## 2020-12-02 DIAGNOSIS — B20 Human immunodeficiency virus [HIV] disease: Secondary | ICD-10-CM | POA: Diagnosis not present

## 2020-12-02 MED ORDER — BIKTARVY 50-200-25 MG PO TABS: ORAL_TABLET | ORAL | 11 refills | Status: DC

## 2020-12-02 NOTE — Progress Notes (Signed)
Virtual Visit via Telephone Note  I connected with Christine Stewart on 12/02/20 at 11:30 AM EST by telephone and verified that I am speaking with the correct person using two identifiers.  Location: Patient: Home Provider: [facility]   I discussed the limitations, risks, security and privacy concerns of performing an evaluation and management service by telephone and the availability of in person appointments. I also discussed with the patient that there may be a patient responsible charge related to this service. The patient expressed understanding and agreed to proceed.   History of Present Illness: I spoke with Christine Stewart by phone today.  She has been having problems with nausea, vomiting and diarrhea over the past week.  She has had trouble keeping food and some liquids down and thinks she may have vomited up her Biktarvy on 1 or 2 occasions.  She denies missing Biktarvy on any other occasions in the past year.  She was feeling well before she got sick 1 week ago.  She has not been having any fever.  She gets some relief from her nausea with Phenergan.  She received her first 2 [place] vaccines in April of last year.   Observations/Objective: CD4 count 11/2019: 585 HIV viral load 11/2019: 160  Assessment and Plan: Her HIV infection has been under good, long-term control.  She will continue Biktarvy and follow-up here in 1 month for repeat lab work.  Encouraged her to get her Covid booster vaccine when she is feeling better.  If she is not feeling better soon she will see her PCP, Dr. Maryella Shivers.  Follow Up Instructions: Continue Biktarvy and follow-up here for lab work in 1 month   I discussed the assessment and treatment plan with the patient. The patient was provided an opportunity to ask questions and all were answered. The patient agreed with the plan and demonstrated an understanding of the instructions.   The patient was advised to call back or seek an in-person evaluation if  the symptoms worsen or if the condition fails to improve as anticipated.  I provided 15 minutes of non-face-to-face time during this encounter.   Michel Bickers, MD

## 2021-01-12 ENCOUNTER — Encounter: Payer: Self-pay | Admitting: Internal Medicine

## 2021-01-12 ENCOUNTER — Ambulatory Visit (INDEPENDENT_AMBULATORY_CARE_PROVIDER_SITE_OTHER): Payer: Medicare HMO | Admitting: Internal Medicine

## 2021-01-12 ENCOUNTER — Other Ambulatory Visit: Payer: Self-pay

## 2021-01-12 DIAGNOSIS — F418 Other specified anxiety disorders: Secondary | ICD-10-CM | POA: Diagnosis not present

## 2021-01-12 DIAGNOSIS — B20 Human immunodeficiency virus [HIV] disease: Secondary | ICD-10-CM

## 2021-01-12 DIAGNOSIS — F17210 Nicotine dependence, cigarettes, uncomplicated: Secondary | ICD-10-CM | POA: Diagnosis not present

## 2021-01-12 DIAGNOSIS — U071 COVID-19: Secondary | ICD-10-CM | POA: Insufficient documentation

## 2021-01-12 DIAGNOSIS — Z113 Encounter for screening for infections with a predominantly sexual mode of transmission: Secondary | ICD-10-CM | POA: Diagnosis not present

## 2021-01-12 DIAGNOSIS — Z79899 Other long term (current) drug therapy: Secondary | ICD-10-CM | POA: Diagnosis not present

## 2021-01-12 MED ORDER — BIKTARVY 50-200-25 MG PO TABS: ORAL_TABLET | ORAL | 11 refills | Status: DC

## 2021-01-12 NOTE — Assessment & Plan Note (Signed)
I encouraged her to continue to cut down and try to quit smoking completely.

## 2021-01-12 NOTE — Progress Notes (Signed)
Patient Active Problem List   Diagnosis Date Noted  . HIV disease ([place]) 01/14/2020    Priority: High  . COVID-19 virus infection 01/12/2021  . Chronic hepatitis C without hepatic coma ([place]) 01/14/2020  . Depression with anxiety 01/14/2020  . GERD (gastroesophageal reflux disease) 01/14/2020  . Chronic back pain 01/14/2020  . Cigarette smoker 01/14/2020    Patient's Medications  New Prescriptions   No medications on file  Previous Medications   GABAPENTIN (NEURONTIN) 300 MG CAPSULE    Take by mouth.   ONDANSETRON (ZOFRAN) 4 MG TABLET    Take 1 tablet (4 mg total) by mouth every 8 (eight) hours as needed for nausea or vomiting.  Modified Medications   Modified Medication Previous Medication   BICTEGRAVIR-EMTRICITABINE-TENOFOVIR AF (BIKTARVY) 50-200-25 MG TABS TABLET bictegravir-emtricitabine-tenofovir AF (BIKTARVY) 50-200-25 MG TABS tablet      TAKE 1 TABLET BY MOUTH EVERY DAY    TAKE 1 TABLET BY MOUTH EVERY DAY  Discontinued Medications   No medications on file    Subjective: Christine Stewart is in for her routine HIV follow-up visit.  She denies any problems obtaining her Biktarvy.  However she became sick about 6 weeks ago with nausea, vomiting, diarrhea, fever and worsening cough and shortness of breath.  She used a home test kit and found that she had COVID again.  She had COVID once in 2020.  She had her initial 2 doses of the [place] last year.  She stopped taking her Biktarvy about 6 weeks ago because of the nausea and vomiting.  She says that the nausea only stopped about 2-1/2 weeks ago but she has not restarted taking Biktarvy yet.  She says that she is off her antidepressants as well and is frustrated because her new PCP told her that he would not prescribe them.  She continues to struggle with insomnia and back pain.  She has cut down on her smoking and since she had COVID but is still smoking 4 cigarettes daily and does not feel she can quit completely at  this time.  Review of Systems: Review of Systems  Constitutional: Positive for malaise/fatigue. Negative for chills, diaphoresis, fever and weight loss.  Respiratory: Positive for cough and shortness of breath.   Cardiovascular: Negative for chest pain.  Gastrointestinal: Positive for diarrhea, nausea and vomiting. Negative for abdominal pain.  Musculoskeletal: Positive for back pain.  Neurological: Positive for headaches.  Psychiatric/Behavioral: Positive for depression. The patient is nervous/anxious and has insomnia.     Past Medical History:  Diagnosis Date  . HIV infection ([place])     Social History   Tobacco Use  . Smoking status: Current Every Day Smoker    Packs/day: 1.00  . Smokeless tobacco: Current User  Substance Use Topics  . Alcohol use: Never  . Drug use: Yes    Types: Marijuana    Family History  Problem Relation Age of Onset  . Cancer Mother   . Anxiety disorder Mother     Allergies  Allergen Reactions  . Efavirenz-Emtricitab-Tenofovir   . Mangifera Indica Swelling  . Pregabalin Swelling  . Prunus Persica Swelling  . Codeine Nausea And Vomiting    Health Maintenance  Topic Date Due  . TETANUS/TDAP  Never done  . PAP SMEAR-Modifier  Never done  . COLONOSCOPY (Pts 45-20yr Insurance coverage will need to be confirmed)  Never done  . MAMMOGRAM  Never done  . COVID-19 Vaccine (3 - Pfizer risk  4-dose series) 02/12/2020  . INFLUENZA VACCINE  04/25/2021  . Hepatitis C Screening  Completed  . HIV Screening  Completed  . HPV VACCINES  Aged Out    Objective:  Vitals:   01/12/21 1133  BP: 126/87  Pulse: (!) 106  Temp: 98 F (36.7 C)  TempSrc: Oral  SpO2: 94%   There is no height or weight on file to calculate BMI.  Physical Exam Constitutional:      Comments: She is very talkative as usual.  She is frustrated but otherwise in no distress.  Cardiovascular:     Rate and Rhythm: Normal rate and regular rhythm.     Heart sounds: No murmur  heard.   Pulmonary:     Effort: Pulmonary effort is normal.     Breath sounds: Wheezing present. No rhonchi or rales.  Abdominal:     Palpations: Abdomen is soft.     Tenderness: There is no abdominal tenderness.  Musculoskeletal:        General: No swelling or tenderness.  Skin:    Findings: No rash.  Psychiatric:        Mood and Affect: Mood normal.     Lab Results No results found for: WBC, HGB, HCT, MCV, PLT No results found for: CREATININE, BUN, NA, K, CL, CO2 No results found for: ALT, AST, GGT, ALKPHOS, BILITOT  No results found for: CHOL, HDL, LDLCALC, LDLDIRECT, TRIG, CHOLHDL No results found for: LABRPR, RPRTITER No results found for: HIV1RNAQUANT, HIV1RNAVL, CD4TABS   Problem List Items Addressed This Visit      High   HIV disease ([place])    I expect her viral load has reactivated since stopping Biktarvy 6 weeks ago.  I instructed her to restart Biktarvy today and to do her best not to miss any doses.  She will get lab work today and follow-up in 6 weeks.        Relevant Medications   bictegravir-emtricitabine-tenofovir AF (BIKTARVY) 50-200-25 MG TABS tablet   Other Relevant Orders   T-helper cell (CD4)- ([facility] clinic only)   HIV-1 RNA quant-no reflex-bld   CBC   Comprehensive metabolic panel   RPR   Lipid panel     Unprioritized   Depression with anxiety    We will have her meet with our behavioral health counselor to review what resources are available to her in this area for counseling.  We will also help her find a new PCP at her request.      Cigarette smoker    I encouraged her to continue to cut down and try to quit smoking completely.      COVID-19 virus infection    I instructed her to get a COVID booster vaccine next month.      Relevant Medications   bictegravir-emtricitabine-tenofovir AF (BIKTARVY) 50-200-25 MG TABS tablet        Michel Bickers, MD [facility] for [place] [phone] pager    [phone] cell 01/12/2021, 12:07 PM

## 2021-01-12 NOTE — Assessment & Plan Note (Signed)
We will have her meet with our behavioral health counselor to review what resources are available to her in this area for counseling.  We will also help her find a new PCP at her request.

## 2021-01-12 NOTE — Assessment & Plan Note (Signed)
I expect her viral load has reactivated since stopping Biktarvy 6 weeks ago.  I instructed her to restart Biktarvy today and to do her best not to miss any doses.  She will get lab work today and follow-up in 6 weeks.

## 2021-01-12 NOTE — Assessment & Plan Note (Signed)
I instructed her to get a COVID booster vaccine next month.

## 2021-01-13 LAB — T-HELPER CELL (CD4) - (RCID CLINIC ONLY)
CD4 % Helper T Cell: 35 % (ref 33–65)
CD4 T Cell Abs: 250 /uL — ABNORMAL LOW (ref 400–1790)

## 2021-01-14 LAB — RPR: RPR Ser Ql: NONREACTIVE

## 2021-01-14 LAB — CBC
HCT: 44.1 % (ref 35.0–45.0)
Hemoglobin: 14.9 g/dL (ref 11.7–15.5)
MCH: 32.1 pg (ref 27.0–33.0)
MCHC: 33.8 g/dL (ref 32.0–36.0)
MCV: 95 fL (ref 80.0–100.0)
MPV: 9.2 fL (ref 7.5–12.5)
Platelets: 211 10*3/uL (ref 140–400)
RBC: 4.64 10*6/uL (ref 3.80–5.10)
RDW: 11.4 % (ref 11.0–15.0)
WBC: 4.4 10*3/uL (ref 3.8–10.8)

## 2021-01-14 LAB — LIPID PANEL
Cholesterol: 136 mg/dL (ref <200)
HDL: 44 mg/dL — ABNORMAL LOW (ref >=50)
LDL Cholesterol (Calc): 77 mg/dL (calc)
Non-HDL Cholesterol (Calc): 92 mg/dL (calc) (ref <130)
Total CHOL/HDL Ratio: 3.1 (calc) (ref <5.0)
Triglycerides: 70 mg/dL (ref <150)

## 2021-01-14 LAB — COMPREHENSIVE METABOLIC PANEL
AG Ratio: 1.3 (calc) (ref 1.0–2.5)
ALT: 23 U/L (ref 6–29)
AST: 23 U/L (ref 10–35)
Albumin: 4.3 g/dL (ref 3.6–5.1)
Alkaline phosphatase (APISO): 81 U/L (ref 37–153)
BUN: 8 mg/dL (ref 7–25)
CO2: 30 mmol/L (ref 20–32)
Calcium: 8.9 mg/dL (ref 8.6–10.4)
Chloride: 101 mmol/L (ref 98–110)
Creat: 0.62 mg/dL (ref 0.50–1.05)
Globulin: 3.2 g/dL (calc) (ref 1.9–3.7)
Glucose, Bld: 90 mg/dL (ref 65–99)
Potassium: 4 mmol/L (ref 3.5–5.3)
Sodium: 139 mmol/L (ref 135–146)
Total Bilirubin: 0.5 mg/dL (ref 0.2–1.2)
Total Protein: 7.5 g/dL (ref 6.1–8.1)

## 2021-01-14 LAB — HIV-1 RNA QUANT-NO REFLEX-BLD
HIV 1 RNA Quant: 31500 Copies/mL — ABNORMAL HIGH
HIV-1 RNA Quant, Log: 4.5 Log cps/mL — ABNORMAL HIGH

## 2021-02-10 DIAGNOSIS — M5416 Radiculopathy, lumbar region: Secondary | ICD-10-CM | POA: Diagnosis not present

## 2021-02-18 DIAGNOSIS — M5416 Radiculopathy, lumbar region: Secondary | ICD-10-CM | POA: Diagnosis not present

## 2021-02-18 DIAGNOSIS — M545 Low back pain, unspecified: Secondary | ICD-10-CM | POA: Diagnosis not present

## 2021-02-22 DIAGNOSIS — M5416 Radiculopathy, lumbar region: Secondary | ICD-10-CM | POA: Diagnosis not present

## 2021-03-08 ENCOUNTER — Other Ambulatory Visit: Payer: Self-pay | Admitting: Infectious Diseases

## 2021-03-08 ENCOUNTER — Ambulatory Visit: Payer: Medicare HMO | Admitting: Internal Medicine

## 2021-03-08 DIAGNOSIS — B20 Human immunodeficiency virus [HIV] disease: Secondary | ICD-10-CM

## 2021-04-14 ENCOUNTER — Telehealth: Payer: Self-pay

## 2021-04-14 NOTE — Telephone Encounter (Signed)
Left HIPAA compliant voicemail requesting callback to reschedule missed appointment.   Beryle Flock, RN

## 2021-06-07 ENCOUNTER — Other Ambulatory Visit: Payer: Self-pay

## 2021-06-07 ENCOUNTER — Ambulatory Visit (INDEPENDENT_AMBULATORY_CARE_PROVIDER_SITE_OTHER): Payer: Medicare HMO | Admitting: Internal Medicine

## 2021-06-07 ENCOUNTER — Encounter: Payer: Self-pay | Admitting: Internal Medicine

## 2021-06-07 DIAGNOSIS — F418 Other specified anxiety disorders: Secondary | ICD-10-CM | POA: Diagnosis not present

## 2021-06-07 DIAGNOSIS — F17210 Nicotine dependence, cigarettes, uncomplicated: Secondary | ICD-10-CM

## 2021-06-07 DIAGNOSIS — B20 Human immunodeficiency virus [HIV] disease: Secondary | ICD-10-CM

## 2021-06-07 DIAGNOSIS — U071 COVID-19: Secondary | ICD-10-CM | POA: Diagnosis not present

## 2021-06-07 NOTE — Assessment & Plan Note (Signed)
She has recovered from her recent COVID infection.

## 2021-06-07 NOTE — Assessment & Plan Note (Signed)
She is currently not ready to try to quit smoking.

## 2021-06-07 NOTE — Assessment & Plan Note (Signed)
She was able to tell me that she worries about her HIV infection every day and that that contributes to her anxiety.  I encouraged her to restart Biktarvy so that that would not have to be something that she worried so much about.  She agrees to start it this evening.  I will arrange phone follow-up in 2 weeks.

## 2021-06-07 NOTE — Progress Notes (Signed)
Patient Active Problem List   Diagnosis Date Noted   HIV disease ([place]) 01/14/2020    Priority: High   COVID-19 virus infection 01/12/2021   Chronic hepatitis C without hepatic coma ([place]) 01/14/2020   Depression with anxiety 01/14/2020   GERD (gastroesophageal reflux disease) 01/14/2020   Chronic back pain 01/14/2020   Cigarette smoker 01/14/2020    Patient's Medications  New Prescriptions   No medications on file  Previous Medications   BICTEGRAVIR-EMTRICITABINE-TENOFOVIR AF (BIKTARVY) 50-200-25 MG TABS TABLET    TAKE 1 TABLET BY MOUTH EVERY DAY   GABAPENTIN (NEURONTIN) 300 MG CAPSULE    Take by mouth.   ONDANSETRON (ZOFRAN) 4 MG TABLET    Take 1 tablet (4 mg total) by mouth every 8 (eight) hours as needed for nausea or vomiting.  Modified Medications   No medications on file  Discontinued Medications   No medications on file    Subjective: Christine Stewart is in for her routine HIV follow-up visit.  She is accompanied today by Christine Stewart, new case manager.  Christine Stewart did not restart Biktarvy as planned after her last visit here in April.  She says that she remains overwhelmed with her depression and anxiety.  She has been off all medications for anxiety, depression and insomnia since her previous mental health provider retired.  She remains upset that her PCP will not prescribe the same medications.  When she was here in April we gave her several clinics to contact for mental health services.  She says that none of them could offer her any help.  She says that 1 clinic said that they could see her in 6 months.  She does not remember which clinic she reached out to.  She says that she is so anxious that she cannot function.  She rarely leaves her house.  Review of Systems: Review of Systems  Constitutional:  Positive for malaise/fatigue. Negative for fever.  Gastrointestinal:  Negative for nausea and vomiting.  Psychiatric/Behavioral:  Positive for depression. Negative for suicidal  ideas. The patient is nervous/anxious and has insomnia.    Past Medical History:  Diagnosis Date   HIV infection ([place])     Social History   Tobacco Use   Smoking status: Some Days    Packs/day: 1.00    Types: Cigarettes   Smokeless tobacco: Current  Substance Use Topics   Alcohol use: Never   Drug use: Yes    Types: Marijuana    Family History  Problem Relation Age of Onset   Cancer Mother    Anxiety disorder Mother     Allergies  Allergen Reactions   Efavirenz-Emtricitab-Tenofovir    Mangifera Indica Swelling   Pregabalin Swelling   Prunus Persica Swelling   Codeine Nausea And Vomiting    Health Maintenance  Topic Date Due   Pneumococcal Vaccine 56-79 Years old (1 - PCV) Never done   TETANUS/TDAP  Never done   Zoster Vaccines- Shingrix (1 of 2) Never done   PAP SMEAR-Modifier  Never done   COLONOSCOPY (Pts 45-27yr Insurance coverage will need to be confirmed)  Never done   MAMMOGRAM  Never done   COVID-19 Vaccine (3 - Pfizer risk series) 02/12/2020   INFLUENZA VACCINE  04/25/2021   Hepatitis C Screening  Completed   HIV Screening  Completed   HPV VACCINES  Aged Out    Objective:  Vitals:   06/07/21 1540  BP: (!) 170/102  Pulse: 99  Temp: 98 F (  36.7 C)  TempSrc: Oral  SpO2: 99%  Weight: 168 lb 6.4 oz (76.4 kg)   Body mass index is 28.02 kg/m.  Physical Exam Constitutional:      Comments: She is very talkative today with somewhat pressured speech.  She is very focused on why other providers will not help her.  She says that she feels like she is losing hope.  Cardiovascular:     Rate and Rhythm: Normal rate.  Pulmonary:     Effort: Pulmonary effort is normal.  Psychiatric:        Behavior: Behavior normal.        Thought Content: Thought content normal.    Lab Results Lab Results  Component Value Date   WBC 4.4 01/12/2021   HGB 14.9 01/12/2021   HCT 44.1 01/12/2021   MCV 95.0 01/12/2021   PLT 211 01/12/2021    Lab Results   Component Value Date   CREATININE 0.62 01/12/2021   BUN 8 01/12/2021   NA 139 01/12/2021   K 4.0 01/12/2021   CL 101 01/12/2021   CO2 30 01/12/2021    Lab Results  Component Value Date   ALT 23 01/12/2021   AST 23 01/12/2021   BILITOT 0.5 01/12/2021    Lab Results  Component Value Date   CHOL 136 01/12/2021   HDL 44 (L) 01/12/2021   LDLCALC 77 01/12/2021   TRIG 70 01/12/2021   CHOLHDL 3.1 01/12/2021   Lab Results  Component Value Date   LABRPR NON-REACTIVE 01/12/2021   HIV 1 RNA Quant (Copies/mL)  Date Value  01/12/2021 31,500 (H)   CD4 T Cell Abs (/uL)  Date Value  01/12/2021 250 (L)     Problem List Items Addressed This Visit       High   HIV disease ([place])    She was able to tell me that she worries about her HIV infection every day and that that contributes to her anxiety.  I encouraged her to restart Biktarvy so that that would not have to be something that she worried so much about.  She agrees to start it this evening.  I will arrange phone follow-up in 2 weeks.        Unprioritized   Depression with anxiety    She has debilitating depression and anxiety.  At our behavioral health counselor meet with Christine Stewart and her new case manager today to discuss the best options for her to be an ongoing behavioral health treatment.      Cigarette smoker    She is currently not ready to try to quit smoking.      COVID-19 virus infection    She has recovered from her recent COVID infection.         Michel Bickers, MD [facility] for [facility] [place] [facility] [phone] pager   [phone] cell 06/07/2021, 5:05 PM

## 2021-06-07 NOTE — Assessment & Plan Note (Signed)
She has debilitating depression and anxiety.  At our behavioral health counselor meet with [name] and her new case manager today to discuss the best options for her to be an ongoing behavioral health treatment.

## 2021-06-21 ENCOUNTER — Ambulatory Visit (INDEPENDENT_AMBULATORY_CARE_PROVIDER_SITE_OTHER): Payer: Medicare HMO | Admitting: Internal Medicine

## 2021-06-21 ENCOUNTER — Encounter: Payer: Self-pay | Admitting: Internal Medicine

## 2021-06-21 ENCOUNTER — Other Ambulatory Visit: Payer: Self-pay

## 2021-06-21 DIAGNOSIS — B20 Human immunodeficiency virus [HIV] disease: Secondary | ICD-10-CM | POA: Diagnosis not present

## 2021-06-21 MED ORDER — ONDANSETRON HCL 4 MG PO TABS: 4.0000 mg | ORAL_TABLET | Freq: Three times a day (TID) | ORAL | 5 refills | Status: AC | PRN

## 2021-06-21 NOTE — Progress Notes (Signed)
Virtual Visit via Telephone Note  I connected with Christine Stewart on 06/21/21 at  3:00 PM EDT by telephone and verified that I am speaking with the correct person using two identifiers.  Location: Patient: Home Provider: [facility]   I discussed the limitations, risks, security and privacy concerns of performing an evaluation and management service by telephone and the availability of in person appointments. I also discussed with the patient that there may be a patient responsible charge related to this service. The patient expressed understanding and agreed to proceed.   History of Present Illness: I called and spoke with Christine Stewart today.  When she was here 2 weeks ago she was still off of her Biktarvy.  She restarted shortly after that visit.  She continues to be bothered by some chronic nausea and has had several episodes of vomiting.  The nausea can occur at anytime of day, even before she takes her Biktarvy.  She gets some relief with ondansetron.  She has her first meeting with behavioral health at [facility] with interest of the [place] tomorrow.  She has filled out paperwork to get a new PCP here in [place].  She feels more hopeful, less anxious and less depressed.   Observations/Objective: HIV 1 RNA Quant (Copies/mL)  Date Value  01/12/2021 31,500 (H)   CD4 T Cell Abs (/uL)  Date Value  01/12/2021 250 (L)    HIV 1 RNA Quant (Copies/mL)  Date Value  01/12/2021 31,500 (H)   CD4 T Cell Abs (/uL)  Date Value  01/12/2021 250 (L)     Assessment and Plan: She is doing better now that we have a plan to address her HIV, nausea, anxiety and depression.  Follow Up Instructions: She will continue Biktarvy and follow-up here for repeat blood work in 4 weeks.   I discussed the assessment and treatment plan with the patient. The patient was provided an opportunity to ask questions and all were answered. The patient agreed with the plan and demonstrated an understanding of the  instructions.   The patient was advised to call back or seek an in-person evaluation if the symptoms worsen or if the condition fails to improve as anticipated.  I provided 17 minutes of non-face-to-face time during this encounter.   Michel Bickers, MD

## 2021-07-26 ENCOUNTER — Ambulatory Visit: Payer: Medicare HMO | Admitting: Internal Medicine

## 2021-08-11 ENCOUNTER — Ambulatory Visit: Payer: Medicare HMO | Admitting: Internal Medicine

## 2021-10-13 ENCOUNTER — Telehealth: Payer: Self-pay

## 2021-10-13 NOTE — Telephone Encounter (Signed)
Called patient to offer overdue follow up appointment, no answer and unable to leave voicemail.  Beryle Flock, RN

## 2021-10-18 ENCOUNTER — Ambulatory Visit: Payer: Medicare HMO | Admitting: Internal Medicine

## 2021-12-20 ENCOUNTER — Ambulatory Visit: Payer: Medicare HMO | Admitting: Internal Medicine

## 2022-01-31 ENCOUNTER — Telehealth: Payer: Self-pay | Admitting: Internal Medicine

## 2022-01-31 ENCOUNTER — Ambulatory Visit: Payer: Medicare HMO | Admitting: Internal Medicine

## 2022-01-31 NOTE — Telephone Encounter (Signed)
Pt called office to resched appt and left a VM. Called pt back and there was no VM to leave a msg.  ?

## 2022-02-28 ENCOUNTER — Encounter: Payer: Self-pay | Admitting: Internal Medicine

## 2022-02-28 ENCOUNTER — Ambulatory Visit (INDEPENDENT_AMBULATORY_CARE_PROVIDER_SITE_OTHER): Payer: Medicare HMO | Admitting: Internal Medicine

## 2022-02-28 ENCOUNTER — Other Ambulatory Visit: Payer: Self-pay

## 2022-02-28 DIAGNOSIS — B20 Human immunodeficiency virus [HIV] disease: Secondary | ICD-10-CM | POA: Diagnosis not present

## 2022-02-28 DIAGNOSIS — R079 Chest pain, unspecified: Secondary | ICD-10-CM | POA: Insufficient documentation

## 2022-02-28 DIAGNOSIS — F418 Other specified anxiety disorders: Secondary | ICD-10-CM | POA: Diagnosis not present

## 2022-02-28 DIAGNOSIS — F17210 Nicotine dependence, cigarettes, uncomplicated: Secondary | ICD-10-CM

## 2022-02-28 NOTE — Assessment & Plan Note (Signed)
I encouraged her to reschedule her recently missed visit with her PCP rather than go to the emergency department for evaluation of this pain that has been going on for 3 weeks.

## 2022-02-28 NOTE — Assessment & Plan Note (Signed)
I encouraged her to use ondansetron and restart her Biktarvy and take it daily.  I will get updated lab work today and arrange a phone follow-up visit in 1 month.

## 2022-02-28 NOTE — Assessment & Plan Note (Signed)
Her mild to moderate chronic depression is unchanged.  She is currently not on any medication and not in counseling

## 2022-02-28 NOTE — Assessment & Plan Note (Signed)
Encouraged her to continue to cut down and hopefully quit smoking cigarettes.

## 2022-02-28 NOTE — Progress Notes (Signed)
[facility]  Patient Active Problem List   Diagnosis Date Noted   HIV disease ([place]) 01/14/2020    Priority: High   Right-sided chest pain 02/28/2022   COVID-19 virus infection 01/12/2021   Chronic hepatitis C without hepatic coma ([place]) 01/14/2020   Depression with anxiety 01/14/2020   GERD (gastroesophageal reflux disease) 01/14/2020   Chronic back pain 01/14/2020   Cigarette smoker 01/14/2020    Patient's Medications  New Prescriptions   No medications on file  Previous Medications   BICTEGRAVIR-EMTRICITABINE-TENOFOVIR AF (BIKTARVY) 50-200-25 MG TABS TABLET    TAKE 1 TABLET BY MOUTH EVERY DAY   GABAPENTIN (NEURONTIN) 300 MG CAPSULE    Take by mouth.   ONDANSETRON (ZOFRAN) 4 MG TABLET    Take 1 tablet (4 mg total) by mouth every 8 (eight) hours as needed for nausea or vomiting.  Modified Medications   No medications on file  Discontinued Medications   No medications on file    Subjective: Christine Stewart is in for her follow-up visit.  She started back on Biktarvy following her last visit.  She says she was taking it consistently until 3 days ago when she stopped.  She says that for the last 3 weeks she has had intermittent right-sided chest pain.  Chronic nausea has gotten worse so she stopped taking Biktarvy although she does not describe having any trouble keeping it down.  She said that her pain comes and goes.  At sometimes it is dull at other times it is sharp.  It seems to get worse when she eats so she says she has not had anything to eat for the past 3 days.  She is not having any heartburn.  She also notes that the pain can sometimes get worse when she takes a deep breath.  She has not had any worsening cough or shortness of breath.  She has not had any dysuria.  She has a history of kidney stones but says that this feels differently.  She has not had any new pain or swelling in her legs.  She had an appointment to see her PCP, Dr. Horald Pollen,  last week to be evaluated for this pain but says she missed it because she was in too much pain.  She tells me that she plans to leave here today and go directly to the emergency department because of the pain.  She says she is down to 4 cigarettes daily.  She is still dealing with depression.  She stopped going to her counselor at [facility] [place] because she did not feel that was helping.  Review of Systems: Review of Systems  Constitutional:  Positive for malaise/fatigue. Negative for chills, diaphoresis, fever and weight loss.  HENT:  Negative for congestion.   Respiratory:  Negative for cough, hemoptysis, sputum production and shortness of breath.   Cardiovascular:  Positive for chest pain.  Gastrointestinal:  Positive for nausea. Negative for abdominal pain, diarrhea, heartburn and vomiting.  Genitourinary:  Negative for dysuria.  Skin:  Negative for rash.  Psychiatric/Behavioral:  Positive for depression.    Past Medical History:  Diagnosis Date   HIV infection ([place])     Social History   Tobacco Use   Smoking status: Some Days    Packs/day: 1.00    Types: Cigarettes   Smokeless tobacco: Current   Tobacco comments:    5 a week  Substance Use Topics   Alcohol use: Never  Drug use: Yes    Types: Marijuana    Family History  Problem Relation Age of Onset   Cancer Mother    Anxiety disorder Mother     Allergies  Allergen Reactions   Efavirenz-Emtricitab-Tenofo Df    Mangifera Indica Swelling   Pregabalin Swelling   Prunus Persica Swelling   Codeine Nausea And Vomiting    Objective: Vitals:   02/28/22 1350  BP: (!) 172/102  Pulse: 89  Temp: 98 F (36.7 C)  TempSrc: Oral  SpO2: 97%  Weight: 170 lb (77.1 kg)   Body mass index is 28.29 kg/m.  Physical Exam Constitutional:      Comments: She is pleasant and talkative.  Cardiovascular:     Rate and Rhythm: Normal rate and regular rhythm.     Heart sounds: No murmur heard. Pulmonary:      Effort: Pulmonary effort is normal.     Breath sounds: No wheezing, rhonchi or rales.  Abdominal:     Palpations: Abdomen is soft. There is no mass.     Tenderness: There is no right CVA tenderness or left CVA tenderness.     Comments: She has mild tenderness with palpation just under her right ribs.  Psychiatric:        Mood and Affect: Mood normal.    Lab Results    Problem List Items Addressed This Visit       High   HIV disease ([place])    I encouraged her to use ondansetron and restart her Biktarvy and take it daily.  I will get updated lab work today and arrange a phone follow-up visit in 1 month.       Relevant Orders   T-helper cells (CD4) count (not at [facility])   HIV-1 RNA quant-no reflex-bld   CBC   Comprehensive metabolic panel     Unprioritized   Depression with anxiety    Her mild to moderate chronic depression is unchanged.  She is currently not on any medication and not in counseling       Cigarette smoker    Encouraged her to continue to cut down and hopefully quit smoking cigarettes.       Right-sided chest pain    I encouraged her to reschedule her recently missed visit with her PCP rather than go to the emergency department for evaluation of this pain that has been going on for 3 weeks.         Michel Bickers, MD [facility] for [facility] [phone] pager   [phone] cell 02/28/2022, 2:39 PM

## 2022-03-01 ENCOUNTER — Telehealth: Payer: Self-pay

## 2022-03-01 LAB — T-HELPER CELLS (CD4) COUNT (NOT AT ARMC)
CD4 % Helper T Cell: 37 % (ref 33–65)
CD4 T Cell Abs: 326 /uL — ABNORMAL LOW (ref 400–1790)

## 2022-03-01 NOTE — Telephone Encounter (Signed)
Patient advised of lab results and verbalized understanding.  

## 2022-03-01 NOTE — Telephone Encounter (Signed)
Patient calling regarding her lab results. Patient also stating she is still having a lot of side pain. I advised the patient if her pain is getting worse she can go to the ED as well.  Please advise on lab results Demetrice Amstutz T Brooks Sailors

## 2022-03-04 LAB — COMPREHENSIVE METABOLIC PANEL
AG Ratio: 1.6 (calc) (ref 1.0–2.5)
ALT: 32 U/L — ABNORMAL HIGH (ref 6–29)
AST: 27 U/L (ref 10–35)
Albumin: 4.7 g/dL (ref 3.6–5.1)
Alkaline phosphatase (APISO): 75 U/L (ref 37–153)
BUN: 11 mg/dL (ref 7–25)
CO2: 31 mmol/L (ref 20–32)
Calcium: 9.5 mg/dL (ref 8.6–10.4)
Chloride: 102 mmol/L (ref 98–110)
Creat: 0.62 mg/dL (ref 0.50–1.03)
Globulin: 3 g/dL (calc) (ref 1.9–3.7)
Glucose, Bld: 81 mg/dL (ref 65–99)
Potassium: 4.1 mmol/L (ref 3.5–5.3)
Sodium: 141 mmol/L (ref 135–146)
Total Bilirubin: 0.4 mg/dL (ref 0.2–1.2)
Total Protein: 7.7 g/dL (ref 6.1–8.1)

## 2022-03-04 LAB — HIV-1 RNA QUANT-NO REFLEX-BLD
HIV 1 RNA Quant: 78500 Copies/mL — ABNORMAL HIGH
HIV-1 RNA Quant, Log: 4.89 Log cps/mL — ABNORMAL HIGH

## 2022-03-04 LAB — CBC
HCT: 46.7 % — ABNORMAL HIGH (ref 35.0–45.0)
Hemoglobin: 16.1 g/dL — ABNORMAL HIGH (ref 11.7–15.5)
MCH: 33.1 pg — ABNORMAL HIGH (ref 27.0–33.0)
MCHC: 34.5 g/dL (ref 32.0–36.0)
MCV: 95.9 fL (ref 80.0–100.0)
MPV: 9.5 fL (ref 7.5–12.5)
Platelets: 188 10*3/uL (ref 140–400)
RBC: 4.87 10*6/uL (ref 3.80–5.10)
RDW: 11.8 % (ref 11.0–15.0)
WBC: 3.2 10*3/uL — ABNORMAL LOW (ref 3.8–10.8)

## 2022-05-15 ENCOUNTER — Telehealth: Payer: Self-pay

## 2022-05-15 NOTE — Telephone Encounter (Signed)
Received call back from patient. Pt c/o bloating, belching, and 8/10 lower back pain, as well as dark urine when she is taking her Biktarvy. States she started back on it on 04/10/22, but stopped on 05/10/22. States that her symptoms have resolved since stopping the medication.   Pt denies nausea. States she has Zofran at home, but only needed it for a few days. States she has not made a PCP appt since problems with her side have resolved.  Requests phone appointment to discuss medication. Follow up visit scheduled with Dr. Megan Salon on 05/17/22.  Routed to provider.  Binnie Kand, RN

## 2022-05-15 NOTE — Telephone Encounter (Signed)
Called patient to follow up on message received from patient's Case Manager, Marlowe Kays. Message stated patient was complaining of GI side effects from [facility] as well as pain; pt stopped medication 2 days ago. Patient requesting virtual appointment.   Called patient to schedule; no answer. Left HIPAA-compliant voicemail requesting call back. Will also send MyChart message.  Binnie Kand, RN

## 2022-05-15 NOTE — Telephone Encounter (Signed)
Error

## 2022-05-17 ENCOUNTER — Ambulatory Visit (INDEPENDENT_AMBULATORY_CARE_PROVIDER_SITE_OTHER): Payer: Medicare HMO | Admitting: Internal Medicine

## 2022-05-17 ENCOUNTER — Other Ambulatory Visit: Payer: Self-pay

## 2022-05-17 ENCOUNTER — Encounter: Payer: Self-pay | Admitting: Internal Medicine

## 2022-05-17 DIAGNOSIS — R140 Abdominal distension (gaseous): Secondary | ICD-10-CM | POA: Insufficient documentation

## 2022-05-17 NOTE — Progress Notes (Signed)
Virtual Visit via Telephone Note  I connected with Christine Stewart on 05/17/22 at 10:00 AM EDT by telephone and verified that I am speaking with the correct person using two identifiers.  Location: Patient: Home Provider: [facility]   I discussed the limitations, risks, security and privacy concerns of performing an evaluation and management service by telephone and the availability of in person appointments. I also discussed with the patient that there may be a patient responsible charge related to this service. The patient expressed understanding and agreed to proceed.   History of Present Illness: I called and spoke with Christine Stewart today.  She restarted Biktarvy on 04/10/2022.  About 1 week later she began to note abdominal bloating, gas, salty taste in her mouth, dark urine and low back pain.  She became worried that she might have hepatitis again but was also concerned that her symptoms were due to [place].  She stopped Biktarvy 6 days ago and her symptoms are resolving.   Observations/Objective: HIV 1 RNA Quant (Copies/mL)  Date Value  02/28/2022 78,500 (H)  01/12/2021 31,500 (H)   CD4 T Cell Abs (/uL)  Date Value  02/28/2022 326 (L)  01/12/2021 250 (L)     Assessment and Plan: She will stay off of [place] for now and come in for blood work and discussion about alternative antiretroviral regimens next week.  Follow Up Instructions: Follow-up here on 05/23/2022   I discussed the assessment and treatment plan with the patient. The patient was provided an opportunity to ask questions and all were answered. The patient agreed with the plan and demonstrated an understanding of the instructions.   The patient was advised to call back or seek an in-person evaluation if the symptoms worsen or if the condition fails to improve as anticipated.  I provided 15 minutes of non-face-to-face time during this encounter.   Michel Bickers, MD

## 2022-05-23 ENCOUNTER — Ambulatory Visit: Payer: Medicare HMO | Admitting: Internal Medicine

## 2022-06-01 ENCOUNTER — Ambulatory Visit (INDEPENDENT_AMBULATORY_CARE_PROVIDER_SITE_OTHER): Payer: Medicare HMO | Admitting: Internal Medicine

## 2022-06-01 ENCOUNTER — Encounter: Payer: Self-pay | Admitting: Internal Medicine

## 2022-06-01 ENCOUNTER — Other Ambulatory Visit: Payer: Self-pay

## 2022-06-01 DIAGNOSIS — B20 Human immunodeficiency virus [HIV] disease: Secondary | ICD-10-CM

## 2022-06-01 NOTE — Progress Notes (Signed)
Patient Active Problem List   Diagnosis Date Noted   HIV disease ([place]) 01/14/2020    Priority: High   Abdominal bloating 05/17/2022   Right-sided chest pain 02/28/2022   COVID-19 virus infection 01/12/2021   Chronic hepatitis C without hepatic coma ([place]) 01/14/2020   Depression with anxiety 01/14/2020   GERD (gastroesophageal reflux disease) 01/14/2020   Chronic back pain 01/14/2020   Cigarette smoker 01/14/2020    Patient's Medications  New Prescriptions   No medications on file  Previous Medications   BICTEGRAVIR-EMTRICITABINE-TENOFOVIR AF (BIKTARVY) 50-200-25 MG TABS TABLET    TAKE 1 TABLET BY MOUTH EVERY DAY   GABAPENTIN (NEURONTIN) 300 MG CAPSULE    Take by mouth.   ONDANSETRON (ZOFRAN) 4 MG TABLET    Take 1 tablet (4 mg total) by mouth every 8 (eight) hours as needed for nausea or vomiting.  Modified Medications   No medications on file  Discontinued Medications   No medications on file    Subjective: Christine Stewart is in for her routine HIV follow-up visit.  She has had difficulty adhering with antiretroviral therapy for quite a while.  I convinced her to restart Biktarvy on 04/10/2022.  About a week later she began to note abdominal bloating, gas, salty taste in her mouth, dark urine back pain.  She was worried that [name] was causing her to have recurrent hepatitis and stopped it.  She says that her symptoms improved after she quit taking Biktarvy.  Review of Systems: Review of Systems  Constitutional:  Negative for fever.  Gastrointestinal:  Negative for abdominal pain, diarrhea, heartburn, nausea and vomiting.    Past Medical History:  Diagnosis Date   HIV infection ([place])     Social History   Tobacco Use   Smoking status: Some Days    Packs/day: 1.00    Types: Cigarettes   Smokeless tobacco: Current   Tobacco comments:    5 a week  Substance Use Topics   Alcohol use: Never   Drug use: Yes    Types: Marijuana    Family History  Problem  Relation Age of Onset   Cancer Mother    Anxiety disorder Mother     Allergies  Allergen Reactions   Efavirenz-Emtricitab-Tenofo Df    Mangifera Indica Swelling   Pregabalin Swelling   Prunus Persica Swelling   Codeine Nausea And Vomiting    Health Maintenance  Topic Date Due   TETANUS/TDAP  Never done   Zoster Vaccines- Shingrix (1 of 2) Never done   PAP SMEAR-Modifier  Never done   COLONOSCOPY (Pts 45-52yrs Insurance coverage will need to be confirmed)  Never done   MAMMOGRAM  Never done   COVID-19 Vaccine (3 - Pfizer risk series) 02/12/2020   INFLUENZA VACCINE  04/25/2022   Hepatitis C Screening  Completed   HIV Screening  Completed   HPV VACCINES  Aged Out    Objective:  Vitals:   06/01/22 1524  BP: (!) 152/100  Pulse: 77  Temp: 98.5 F (36.9 C)  TempSrc: Oral  Weight: 172 lb (78 kg)  Height: $Remove'5\' 5"'fuYEYhr$  (1.651 m)   Body mass index is 28.62 kg/m.  Physical Exam Constitutional:      Comments: She is in good spirits.  Cardiovascular:     Rate and Rhythm: Normal rate and regular rhythm.     Heart sounds: No murmur heard. Pulmonary:     Effort: Pulmonary effort is normal.  Abdominal:  Palpations: Abdomen is soft.     Tenderness: There is no abdominal tenderness.  Psychiatric:        Mood and Affect: Mood normal.     Lab Results Lab Results  Component Value Date   WBC 3.2 (L) 02/28/2022   HGB 16.1 (H) 02/28/2022   HCT 46.7 (H) 02/28/2022   MCV 95.9 02/28/2022   PLT 188 02/28/2022    Lab Results  Component Value Date   CREATININE 0.62 02/28/2022   BUN 11 02/28/2022   NA 141 02/28/2022   K 4.1 02/28/2022   CL 102 02/28/2022   CO2 31 02/28/2022    Lab Results  Component Value Date   ALT 32 (H) 02/28/2022   AST 27 02/28/2022   BILITOT 0.4 02/28/2022    Lab Results  Component Value Date   CHOL 136 01/12/2021   HDL 44 (L) 01/12/2021   LDLCALC 77 01/12/2021   TRIG 70 01/12/2021   CHOLHDL 3.1 01/12/2021   Lab Results  Component Value  Date   LABRPR NON-REACTIVE 01/12/2021   HIV 1 RNA Quant (Copies/mL)  Date Value  02/28/2022 78,500 (H)  01/12/2021 31,500 (H)   CD4 T Cell Abs (/uL)  Date Value  02/28/2022 326 (L)  01/12/2021 250 (L)     Problem List Items Addressed This Visit       High   HIV disease ([place])    I do not have records to indicate what she took prior to starting [place] several years ago.  She thinks she may have been on Atripla.  I will check her lab work today including [place] and consider starting [place] soon.      Relevant Orders   T-helper cells (CD4) count (not at [facility])   HIV-1 RNA quant-no reflex-bld   HLA B*[id number]      Michel Bickers, MD [facility] for [place] [phone] pager   [phone] cell 06/01/2022, 3:46 PM

## 2022-06-01 NOTE — Assessment & Plan Note (Signed)
I do not have records to indicate what she took prior to starting [place] several years ago.  She thinks she may have been on Atripla.  I will check her lab work today including [place] and consider starting [place] soon.

## 2022-06-02 LAB — T-HELPER CELLS (CD4) COUNT (NOT AT ARMC)
CD4 % Helper T Cell: 37 % (ref 33–65)
CD4 T Cell Abs: 322 /uL — ABNORMAL LOW (ref 400–1790)

## 2022-06-07 LAB — HIV-1 RNA QUANT-NO REFLEX-BLD
HIV 1 RNA Quant: 27300 Copies/mL — ABNORMAL HIGH
HIV-1 RNA Quant, Log: 4.44 Log cps/mL — ABNORMAL HIGH

## 2022-06-07 LAB — HLA B*5701: HLA-B*5701 w/rflx HLA-B High: NEGATIVE

## 2022-06-15 ENCOUNTER — Encounter: Payer: Self-pay | Admitting: Internal Medicine

## 2022-06-15 ENCOUNTER — Other Ambulatory Visit: Payer: Self-pay

## 2022-06-15 ENCOUNTER — Ambulatory Visit (INDEPENDENT_AMBULATORY_CARE_PROVIDER_SITE_OTHER): Payer: Medicare HMO | Admitting: Internal Medicine

## 2022-06-15 DIAGNOSIS — B20 Human immunodeficiency virus [HIV] disease: Secondary | ICD-10-CM

## 2022-06-15 NOTE — Progress Notes (Signed)
Virtual Visit via Video Note  I connected with Christine Stewart on 06/15/22 at 11:00 AM EDT by a video enabled telemedicine application and verified that I am speaking with the correct person using two identifiers.  Location: Patient: Home Provider: [facility]   I discussed the limitations of evaluation and management by telemedicine and the availability of in person appointments. The patient expressed understanding and agreed to proceed.  History of Present Illness: I called and spoke with Christine Stewart today.  She recently developed abdominal bloating and a salty taste in her mouth while taking her Biktarvy.  Symptoms have now resolved after stopping it.  When she was in a few weeks ago I ordered an HLA [id number] which came back negative.  The only thing she is currently taking is an herbal supplement.  She does not take an acids or iron supplements.   Observations/Objective: HIV 1 RNA Quant (Copies/mL)  Date Value  06/01/2022 27,300 (H)  02/28/2022 78,500 (H)  01/12/2021 31,500 (H)   CD4 T Cell Abs (/uL)  Date Value  06/01/2022 322 (L)  02/28/2022 326 (L)  01/12/2021 250 (L)     Assessment and Plan: I will start her on Triumeq now.  She is in agreement with that plan  Follow Up Instructions: Triumeq 1 daily with or without food.  She plans to take it each evening. Follow-up in 6 weeks   I discussed the assessment and treatment plan with the patient. The patient was provided an opportunity to ask questions and all were answered. The patient agreed with the plan and demonstrated an understanding of the instructions.   The patient was advised to call back or seek an in-person evaluation if the symptoms worsen or if the condition fails to improve as anticipated.  I provided 16 minutes of non-face-to-face time during this encounter.   Michel Bickers, MD

## 2022-06-16 ENCOUNTER — Encounter (INDEPENDENT_AMBULATORY_CARE_PROVIDER_SITE_OTHER): Payer: Self-pay

## 2022-06-19 ENCOUNTER — Other Ambulatory Visit: Payer: Self-pay | Admitting: Pharmacist

## 2022-06-19 ENCOUNTER — Telehealth: Payer: Self-pay

## 2022-06-19 ENCOUNTER — Other Ambulatory Visit: Payer: Self-pay

## 2022-06-19 DIAGNOSIS — B20 Human immunodeficiency virus [HIV] disease: Secondary | ICD-10-CM

## 2022-06-19 MED ORDER — TRIUMEQ 600-50-300 MG PO TABS: 1.0000 | ORAL_TABLET | Freq: Every day | ORAL | 5 refills | Status: DC

## 2022-06-19 NOTE — Telephone Encounter (Signed)
Received email from Case manager with medication issues. Patient is having trouble getting her Triumeq from pharmacy. Routing to [facility] PharmD to assist with d/c Biktarvy and ordering Triumeq per Dr. Chilton Greathouse last encounter.

## 2022-06-19 NOTE — Telephone Encounter (Signed)
Triumeq sent to [facility]. Thanks!

## 2022-07-25 ENCOUNTER — Ambulatory Visit: Payer: Medicare HMO | Admitting: Internal Medicine

## 2022-08-03 ENCOUNTER — Telehealth: Payer: Self-pay

## 2022-08-03 NOTE — Telephone Encounter (Signed)
Detectable Viral Load Intervention   Most recent VL: 27,300  Current ART regimen: Triumeq  Appointment status: patient has future appointment scheduled  Called patient to discuss medication adherence and possible barriers to care.    Questionnaire  Medication Adherence What pharmacy do you use for your ART? [facility] in [place], [place]  Do you pick up your medication at the pharmacy or is it mailed to you? pick up at pharmacy  How often do you miss a dose your ART? Has not missed any doses recently. Reports she missed about 4 days worth when her husband had a work accident, but that her adherence has overall improved.   Are you experiencing any side effects with your ART? No side effects, she has been having difficulty swallowing the larger pill and reports it has gotten "stuck" in her throat a couple of times. She called the pharmacy who advised her not to split or crush the pill.   Since your last visit, have you experienced any changes that have negatively impacted your health? NO  Are you having any trouble remembering what medication(s) you are supposed to take or how you are supposed to take them? NO  What helps you remember to take your medication(s)? She takes her medicine every night at 8 PM.    Barriers to Care Are you experiencing any of the following?:  Lack of transportation to medical appointments? NO  Housing instability? NO - lives with her husband and three dogs, feels safe with her current living arrangements.   If you are currently employed, are you having difficulty taking time off of work for medical appointments? NO, not currently employed.   Financial concerns (rent, utilities, etc.) NO  Lack of consistent access to food? NO  Trouble remembering and attending your appointments? Feels that she has episodes of brain fog, has been using her phone to keep track of appointments and feels this has been helping. She confirms appointment for next week.   Are you  experiencing any other barriers that make it hard for you to come to appointments or take medication regularly? She lives in [place], [place] which is about 40 minutes away from our office.   She would like to know if she can have a glass of wine at night, she says she was told by the [facility] that she should not have alcohol or herbs. When asked what herbs she takes, she could not remember. Asked her to please send a list through [facility] so that we can check for interactions. She has not been taking any herbs since being switched to Triumeq.   Overall, she is much happier with the Triumeq compared with Biktarvy.    Interventions:    Discussed ways to improve swallowing Triumeq, such as taking the pill with pudding/applesauce instead of thin liquids. She would also like to decrease the frequency of her visits since her drive is 40 minutes. We discussed that becoming and remaninig undetectable would help to aid in this. Will have her send list of herbs/supplements to check for interactions.   Beryle Flock, RN

## 2022-08-08 ENCOUNTER — Ambulatory Visit: Payer: Medicare HMO | Admitting: Internal Medicine

## 2022-09-11 ENCOUNTER — Other Ambulatory Visit (HOSPITAL_COMMUNITY): Payer: Self-pay

## 2022-09-12 ENCOUNTER — Other Ambulatory Visit: Payer: Self-pay

## 2022-09-12 ENCOUNTER — Ambulatory Visit (INDEPENDENT_AMBULATORY_CARE_PROVIDER_SITE_OTHER): Payer: Medicare HMO

## 2022-09-12 ENCOUNTER — Encounter: Payer: Self-pay | Admitting: Internal Medicine

## 2022-09-12 ENCOUNTER — Ambulatory Visit (INDEPENDENT_AMBULATORY_CARE_PROVIDER_SITE_OTHER): Payer: Medicare HMO | Admitting: Internal Medicine

## 2022-09-12 VITALS — BP 139/86 | HR 80 | Temp 98.0°F | Ht 65.0 in | Wt 174.0 lb

## 2022-09-12 DIAGNOSIS — F418 Other specified anxiety disorders: Secondary | ICD-10-CM

## 2022-09-12 DIAGNOSIS — Z23 Encounter for immunization: Secondary | ICD-10-CM

## 2022-09-12 DIAGNOSIS — B20 Human immunodeficiency virus [HIV] disease: Secondary | ICD-10-CM

## 2022-09-12 DIAGNOSIS — F17210 Nicotine dependence, cigarettes, uncomplicated: Secondary | ICD-10-CM

## 2022-09-12 NOTE — Assessment & Plan Note (Signed)
She has had a great deal of difficulty staying on antiretroviral medication in the past.  Sounds like she is doing better now and tolerating Triumeq well.  She will get updated lab work today and follow-up in 4 weeks.  She received her annual influenza vaccine and an updated COVID-vaccine here today.

## 2022-09-12 NOTE — Assessment & Plan Note (Signed)
I encouraged her to continue her efforts to cut down and hopefully quit smoking completely.

## 2022-09-12 NOTE — Progress Notes (Signed)
Patient Active Problem List   Diagnosis Date Noted   HIV disease ([place]) 01/14/2020    Priority: High   Abdominal bloating 05/17/2022   Right-sided chest pain 02/28/2022   COVID-19 virus infection 01/12/2021   Chronic hepatitis C without hepatic coma ([place]) 01/14/2020   Depression with anxiety 01/14/2020   GERD (gastroesophageal reflux disease) 01/14/2020   Chronic back pain 01/14/2020   Cigarette smoker 01/14/2020    Patient's Medications  New Prescriptions   No medications on file  Previous Medications   ABACAVIR-DOLUTEGRAVIR-LAMIVUDINE (TRIUMEQ) 600-50-300 MG TABLET    Take 1 tablet by mouth daily.   GABAPENTIN (NEURONTIN) 300 MG CAPSULE    Take by mouth.   ONDANSETRON (ZOFRAN) 4 MG TABLET    Take 1 tablet (4 mg total) by mouth every 8 (eight) hours as needed for nausea or vomiting.   OVER THE COUNTER MEDICATION    Patient states she is taking mullein liquid, over the counter  Modified Medications   No medications on file  Discontinued Medications   No medications on file    Subjective: Christine Stewart is in for her routine HIV follow-up visit.  She says that she started Triumeq after her visit in September.  On 1 occasion shortly after starting it she had trouble swallowing the pill and felt like it got stuck in her throat.  Otherwise she is tolerating it well.  She takes it each evening around 8:30 PM.  She recalls missing only 1 dose when she came home late and very tired.  However, she says she is on her second bottle suggesting that she started well after her last visit here or has been missing more doses than she recalls.  She says that her mood has been better.  She is feeling less anxious and depressed.  She has been trying hard to cut down on her cigarettes and says that today she is only smoked 1 cigarette.  She is out of her albuterol inhaler and says that she has not been able to get in touch with Dr. Darron Doom and that her pharmacy has called 3 times to request refills  but has not heard back from the office yet.  Review of Systems: Review of Systems  Constitutional:  Negative for fever and weight loss.  Respiratory:  Positive for cough, shortness of breath and wheezing. Negative for sputum production.   Psychiatric/Behavioral:  Positive for depression. The patient is nervous/anxious.     Past Medical History:  Diagnosis Date   HIV infection ([place])     Social History   Tobacco Use   Smoking status: Some Days    Packs/day: 0.10    Types: Cigarettes   Smokeless tobacco: Current   Tobacco comments:    States about a pack a week  Substance Use Topics   Alcohol use: Never   Drug use: Yes    Types: Marijuana    Comment: Smokes daily    Family History  Problem Relation Age of Onset   Cancer Mother    Anxiety disorder Mother     Allergies  Allergen Reactions   Efavirenz-Emtricitab-Tenofo Df     Atripla   Mangifera Indica Swelling    Mango   Pregabalin Swelling   Prunus Persica Swelling    Peaches   Codeine Nausea And Vomiting    Health Maintenance  Topic Date Due   DTaP/Tdap/Td (1 - Tdap) Never done   Zoster Vaccines- Shingrix (1 of 2) Never done  PAP SMEAR-Modifier  Never done   COLONOSCOPY (Pts 45-75yr Insurance coverage will need to be confirmed)  Never done   MAMMOGRAM  Never done   COVID-19 Vaccine (3 - Pfizer risk series) 02/12/2020   Medicare Annual Wellness (AWV)  05/26/2021   INFLUENZA VACCINE  04/25/2022   Hepatitis C Screening  Completed   HIV Screening  Completed   HPV VACCINES  Aged Out    Objective:  Vitals:   09/12/22 1534  BP: 139/86  Pulse: 80  Temp: 98 F (36.7 C)  TempSrc: Temporal  SpO2: 98%  Weight: 174 lb (78.9 kg)  Height: '5\' 5"'$  (1.651 m)   Body mass index is 28.96 kg/m.  Physical Exam Constitutional:      Comments: She appears more calm and in better spirits.  She is talkative.  Cardiovascular:     Rate and Rhythm: Normal rate and regular rhythm.     Heart sounds: No murmur  heard. Pulmonary:     Effort: Pulmonary effort is normal.     Breath sounds: No wheezing, rhonchi or rales.  Psychiatric:        Mood and Affect: Mood normal.     Lab Results Lab Results  Component Value Date   WBC 3.2 (L) 02/28/2022   HGB 16.1 (H) 02/28/2022   HCT 46.7 (H) 02/28/2022   MCV 95.9 02/28/2022   PLT 188 02/28/2022    Lab Results  Component Value Date   CREATININE 0.62 02/28/2022   BUN 11 02/28/2022   NA 141 02/28/2022   K 4.1 02/28/2022   CL 102 02/28/2022   CO2 31 02/28/2022    Lab Results  Component Value Date   ALT 32 (H) 02/28/2022   AST 27 02/28/2022   BILITOT 0.4 02/28/2022    Lab Results  Component Value Date   CHOL 136 01/12/2021   HDL 44 (L) 01/12/2021   LDLCALC 77 01/12/2021   TRIG 70 01/12/2021   CHOLHDL 3.1 01/12/2021   Lab Results  Component Value Date   LABRPR NON-REACTIVE 01/12/2021   HIV 1 RNA Quant (Copies/mL)  Date Value  06/01/2022 27,300 (H)  02/28/2022 78,500 (H)  01/12/2021 31,500 (H)   CD4 T Cell Abs (/uL)  Date Value  06/01/2022 322 (L)  02/28/2022 326 (L)  01/12/2021 250 (L)     Problem List Items Addressed This Visit       High   HIV disease ([place] - Primary    She has had a great deal of difficulty staying on antiretroviral medication in the past.  Sounds like she is doing better now and tolerating Triumeq well.  She will get updated lab work today and follow-up in 4 weeks.  She received her annual influenza vaccine and an updated COVID-vaccine here today.      Relevant Orders   [facility]   HIV-1 RNA quant-no reflex-bld     Unprioritized   Depression with anxiety    Her chronic anxiety and depression are improved over the last few months.  I suspect she may be feeling better knowing that she is back on treatment for her HIV infection.      Cigarette smoker    I encouraged her to continue her efforts to cut down and hopefully quit smoking completely.         JMichel Bickers MD [facility] [phone]   [phone] 09/12/2022, 4:09 PM

## 2022-09-12 NOTE — Assessment & Plan Note (Signed)
Her chronic anxiety and depression are improved over the last few months.  I suspect she may be feeling better knowing that she is back on treatment for her HIV infection.

## 2022-09-13 LAB — T-HELPER CELLS (CD4) COUNT (NOT AT ARMC)
CD4 % Helper T Cell: 43 % (ref 33–65)
CD4 T Cell Abs: 484 /uL (ref 400–1790)

## 2022-09-14 LAB — HIV-1 RNA QUANT-NO REFLEX-BLD
HIV 1 RNA Quant: NOT DETECTED Copies/mL
HIV-1 RNA Quant, Log: NOT DETECTED Log cps/mL

## 2022-10-10 ENCOUNTER — Ambulatory Visit (INDEPENDENT_AMBULATORY_CARE_PROVIDER_SITE_OTHER): Payer: Medicare HMO | Admitting: Internal Medicine

## 2022-10-10 ENCOUNTER — Other Ambulatory Visit: Payer: Self-pay

## 2022-10-10 ENCOUNTER — Encounter: Payer: Self-pay | Admitting: Internal Medicine

## 2022-10-10 DIAGNOSIS — B20 Human immunodeficiency virus [HIV] disease: Secondary | ICD-10-CM | POA: Diagnosis not present

## 2022-10-10 MED ORDER — TRIUMEQ 600-50-300 MG PO TABS: 1.0000 | ORAL_TABLET | Freq: Every day | ORAL | 11 refills | Status: DC

## 2022-10-10 NOTE — Progress Notes (Signed)
Virtual Visit via Video Note  I connected with Christine Stewart on 10/10/22 at  2:15 PM EST by a video enabled telemedicine application and verified that I am speaking with the correct person using two identifiers.  Location: Patient: Home Provider: [facility]   I discussed the limitations of evaluation and management by telemedicine and the availability of in person appointments. The patient expressed understanding and agreed to proceed.  History of Present Illness: I called and spoke with Christine Stewart today.  She says that she is continuing to do very well with Triumeq.  She has no problems tolerating it and has not missed any doses since her last visit.  Her mood is improved and she is feeling much less anxious and depressed.  She continues to work on trying to cut down and quit smoking cigarettes.   Observations/Objective: Lab Results  Component Value Date   CD4TCELL 43 09/12/2022   CD4TABS 484 09/12/2022  HIV viral load undetectable  Assessment and Plan: She is doing very well on her new salvage regimen.  Her virus is fully suppressed and she has had prompt CD4 reconstitution.  Follow Up Instructions: Continue Triumeq and follow-up here in 6 months   I discussed the assessment and treatment plan with the patient. The patient was provided an opportunity to ask questions and all were answered. The patient agreed with the plan and demonstrated an understanding of the instructions.   The patient was advised to call back or seek an in-person evaluation if the symptoms worsen or if the condition fails to improve as anticipated.  I provided 14 minutes of non-face-to-face time during this encounter.   Michel Bickers, MD

## 2023-02-21 ENCOUNTER — Telehealth: Payer: Self-pay | Admitting: Pharmacist

## 2023-02-21 NOTE — Telephone Encounter (Signed)
Called the patient to discuss starting ketamine for treatment resistant depression with her HIV medication, Triumeq. I told her there is no interactions between the two and should be fine for when she starts. The patient stated she is gathering information in the case she does start ketamine, but no appointment has been scheduled yet. She was not sure how ketamine worked and wanted to talk about some of the side effects. I went over some of the side effects of ketamine, but would be better to contact the clinic to get more information. We talked about any interactions between Triumeq and Ozempic in case she starts this in the future, and I counseled her that there is no interactions there either. Cenia did have a question about her refills and based on prescription sent in by Dr. Orvan Falconer, she should be set for 1 year. She is aware to call back if any issues with getting refills.  Thanks,  Arabella Merles, PharmD. [facility] Acute Care PGY-1 02/21/2023 3:14 PM

## 2023-03-07 NOTE — Progress Notes (Signed)
The 10-year ASCVD risk score (Arnett DK, et al., 2019) is: 5%   Values used to calculate the score:     Age: 57 years     Sex: Female     Is Non-Hispanic African American: No     Diabetic: No     Tobacco smoker: Yes     Systolic Blood Pressure: 139 mmHg     Is BP treated: No     HDL Cholesterol: 44 mg/dL     Total Cholesterol: 136 mg/dL  Sandie Ano, RN

## 2023-04-12 ENCOUNTER — Ambulatory Visit: Payer: Medicare HMO | Admitting: Internal Medicine

## 2023-04-16 ENCOUNTER — Ambulatory Visit: Payer: Self-pay | Admitting: Internal Medicine

## 2023-07-10 ENCOUNTER — Encounter: Payer: Self-pay | Admitting: Internal Medicine

## 2023-07-10 ENCOUNTER — Telehealth: Payer: Self-pay

## 2023-07-10 ENCOUNTER — Ambulatory Visit (INDEPENDENT_AMBULATORY_CARE_PROVIDER_SITE_OTHER): Payer: Medicare HMO | Admitting: Internal Medicine

## 2023-07-10 ENCOUNTER — Other Ambulatory Visit: Payer: Self-pay

## 2023-07-10 VITALS — BP 133/89 | HR 87 | Resp 16 | Ht 65.0 in | Wt 172.0 lb

## 2023-07-10 DIAGNOSIS — B20 Human immunodeficiency virus [HIV] disease: Secondary | ICD-10-CM

## 2023-07-10 DIAGNOSIS — B182 Chronic viral hepatitis C: Secondary | ICD-10-CM

## 2023-07-10 DIAGNOSIS — Z23 Encounter for immunization: Secondary | ICD-10-CM

## 2023-07-10 DIAGNOSIS — R110 Nausea: Secondary | ICD-10-CM | POA: Diagnosis not present

## 2023-07-10 DIAGNOSIS — Z0000 Encounter for general adult medical examination without abnormal findings: Secondary | ICD-10-CM

## 2023-07-10 MED ORDER — ONDANSETRON 4 MG PO TBDP: 4.0000 mg | ORAL_TABLET | Freq: Three times a day (TID) | ORAL | 0 refills | Status: AC | PRN

## 2023-07-10 MED ORDER — FAMOTIDINE 20 MG PO TABS: 20.0000 mg | ORAL_TABLET | Freq: Every day | ORAL | 0 refills | Status: AC

## 2023-07-10 MED ORDER — TRIUMEQ 600-50-300 MG PO TABS
1.0000 | ORAL_TABLET | Freq: Every day | ORAL | 11 refills | Status: DC
Start: 2023-07-10 — End: 2024-08-13

## 2023-07-10 NOTE — Telephone Encounter (Signed)
Patient prefers to schedule all appts and studies once she returns from out of town in a few weeks. She was given the proper number from [facility] to schedule the following:  Call Scheduling number given and schedule both: Abd U/S -order placed  Mammogram- order placed.  Calling [facility] to schedule: PAP with [facility] (if no better after trip) 6 month f/u with Dr. Drue Second.

## 2023-07-10 NOTE — Progress Notes (Unsigned)
RFV: follow up for hiv disease  Patient ID: Christine Stewart, female   DOB: 11-04-1965, 57 y.o.   MRN: [id number]  HPI 57yo F with well controlled hiv disease, CD 4 count 484/VL<20 in june 2023, chronic hepatitis C has been tolerating triumeq. However in the past 2 wks of nausea,  Hasn't been eating because fear of vomiting; decreased intake due to symptoms   3 days of "pit of stomach pain"    Outpatient Encounter Medications as of 07/10/2023  Medication Sig   abacavir-dolutegravir-lamiVUDine (TRIUMEQ) 600-50-300 MG tablet Take 1 tablet by mouth daily.   gabapentin (NEURONTIN) 300 MG capsule Take by mouth. (Patient not taking: Reported on 06/15/2022)   ondansetron (ZOFRAN) 4 MG tablet Take 1 tablet (4 mg total) by mouth every 8 (eight) hours as needed for nausea or vomiting. (Patient not taking: Reported on 07/10/2023)   OVER THE COUNTER MEDICATION Patient states she is taking mullein liquid, over the counter (Patient not taking: Reported on 07/10/2023)   No facility-administered encounter medications on file as of 07/10/2023.     Patient Active Problem List   Diagnosis Date Noted   Abdominal bloating 05/17/2022   Right-sided chest pain 02/28/2022   COVID-19 virus infection 01/12/2021   HIV disease ([facility]) 01/14/2020   Chronic hepatitis C without hepatic coma ([facility]) 01/14/2020   Depression with anxiety 01/14/2020   GERD (gastroesophageal reflux disease) 01/14/2020   Chronic back pain 01/14/2020   Cigarette smoker 01/14/2020     Health Maintenance Due  Topic Date Due   DTaP/Tdap/Td (1 - Tdap) Never done   Zoster Vaccines- Shingrix (1 of 2) Never done   Cervical Cancer Screening (HPV/Pap Cotest)  Never done   Colonoscopy  Never done   MAMMOGRAM  Never done   Medicare Annual Wellness (AWV)  05/26/2021   INFLUENZA VACCINE  04/26/2023   COVID-19 Vaccine (4 - 2023-24 season) 05/27/2023     Review of Systems 12 point ros is negative except what is mentioned above, except  epigastric pain Physical Exam   Resp 16   Ht 5\' 5"  (1.651 m)   Wt 172 lb (78 kg)   LMP  (LMP Unknown)   BMI 28.62 kg/m   Physical Exam  Constitutional:  oriented to person, place, and time. appears well-developed and well-nourished. No distress.  HENT: [place]/AT, PERRLA, no scleral icterus Mouth/Throat: Oropharynx is clear and moist. No oropharyngeal exudate.  Cardiovascular: Normal rate, regular rhythm and normal heart sounds. Exam reveals no gallop and no friction rub.  No murmur heard.  Pulmonary/Chest: Effort normal and breath sounds normal. No respiratory distress.  has no wheezes.  Neck = supple, no nuchal rigidity Abdominal: Soft. Bowel sounds are normal.  exhibits no distension. There is no tenderness.  Lymphadenopathy: no cervical adenopathy. No axillary adenopathy Neurological: alert and oriented to person, place, and time.  Skin: Skin is warm and dry. No rash noted. No erythema.  Psychiatric: a normal mood and affect.  behavior is normal.   Lab Results  Component Value Date   CD4TCELL 43 09/12/2022   Lab Results  Component Value Date   CD4TABS 484 09/12/2022   CD4TABS 322 (L) 06/01/2022   CD4TABS 326 (L) 02/28/2022   Lab Results  Component Value Date   HIV1RNAQUANT Not Detected 09/12/2022   No results found for: "HEPBSAB" Lab Results  Component Value Date   LABRPR NON-REACTIVE 01/12/2021    CBC Lab Results  Component Value Date   WBC 3.2 (L) 02/28/2022   RBC 4.87  02/28/2022   HGB 16.1 (H) 02/28/2022   HCT 46.7 (H) 02/28/2022   PLT 188 02/28/2022   MCV 95.9 02/28/2022   MCH 33.1 (H) 02/28/2022   MCHC 34.5 02/28/2022   RDW 11.8 02/28/2022    BMET Lab Results  Component Value Date   NA 141 02/28/2022   K 4.1 02/28/2022   CL 102 02/28/2022   CO2 31 02/28/2022   GLUCOSE 81 02/28/2022   BUN 11 02/28/2022   CREATININE 0.62 02/28/2022   CALCIUM 9.5 02/28/2022      Assessment and Plan  Hiv disease = will check labs   Long term medication  management = will check cr  Nausea = do trial of zofran; at night famotidine;H.pylori breath test-postponed  Chronic Hepatitis c = treated for hep c; will need u/s  Health maintenance = Flu shot today. As well as mammo  I have personally spent 40 minutes involved in face-to-face and non-face-to-face activities for this patient on the day of the visit. Professional time spent includes the following activities: Preparing to see the patient (review of tests), Obtaining and/or reviewing separately obtained history (admission/discharge record), Performing a medically appropriate examination and/or evaluation , Ordering medications/tests/procedures, referring and communicating with other health care professionals, Documenting clinical information in the EMR, Independently interpreting results (not separately reported), Communicating results to the patient/family/caregiver, Counseling and educating the patient/family/caregiver and Care coordination (not separately reported).

## 2023-07-11 LAB — T-HELPER CELL (CD4) - (RCID CLINIC ONLY)
CD4 % Helper T Cell: 44 % (ref 33–65)
CD4 T Cell Abs: 493 /uL (ref 400–1790)

## 2023-07-13 LAB — CBC WITH DIFFERENTIAL/PLATELET
Absolute Lymphocytes: 1325 {cells}/uL (ref 850–3900)
Absolute Monocytes: 334 {cells}/uL (ref 200–950)
Basophils Absolute: 28 {cells}/uL (ref 0–200)
Basophils Relative: 0.6 %
Eosinophils Absolute: 89 {cells}/uL (ref 15–500)
Eosinophils Relative: 1.9 %
HCT: 43.8 % (ref 35.0–45.0)
Hemoglobin: 15.1 g/dL (ref 11.7–15.5)
MCH: 34.2 pg — ABNORMAL HIGH (ref 27.0–33.0)
MCHC: 34.5 g/dL (ref 32.0–36.0)
MCV: 99.3 fL (ref 80.0–100.0)
MPV: 9.5 fL (ref 7.5–12.5)
Monocytes Relative: 7.1 %
Neutro Abs: 2923 {cells}/uL (ref 1500–7800)
Neutrophils Relative %: 62.2 %
Platelets: 180 10*3/uL (ref 140–400)
RBC: 4.41 10*6/uL (ref 3.80–5.10)
RDW: 11.8 % (ref 11.0–15.0)
Total Lymphocyte: 28.2 %
WBC: 4.7 10*3/uL (ref 3.8–10.8)

## 2023-07-13 LAB — COMPLETE METABOLIC PANEL WITH GFR
AG Ratio: 1.6 (calc) (ref 1.0–2.5)
ALT: 26 U/L (ref 6–29)
AST: 23 U/L (ref 10–35)
Albumin: 4.4 g/dL (ref 3.6–5.1)
Alkaline phosphatase (APISO): 77 U/L (ref 37–153)
BUN: 18 mg/dL (ref 7–25)
CO2: 28 mmol/L (ref 20–32)
Calcium: 9.6 mg/dL (ref 8.6–10.4)
Chloride: 102 mmol/L (ref 98–110)
Creat: 0.74 mg/dL (ref 0.50–1.03)
Globulin: 2.7 g/dL (ref 1.9–3.7)
Glucose, Bld: 87 mg/dL (ref 65–99)
Potassium: 4 mmol/L (ref 3.5–5.3)
Sodium: 139 mmol/L (ref 135–146)
Total Bilirubin: 0.4 mg/dL (ref 0.2–1.2)
Total Protein: 7.1 g/dL (ref 6.1–8.1)
eGFR: 95 mL/min/{1.73_m2} (ref >=60)

## 2023-07-13 LAB — LIPID PANEL
Cholesterol: 152 mg/dL (ref <200)
HDL: 53 mg/dL (ref >=50)
LDL Cholesterol (Calc): 79 mg/dL
Non-HDL Cholesterol (Calc): 99 mg/dL (ref <130)
Total CHOL/HDL Ratio: 2.9 (calc) (ref <5.0)
Triglycerides: 116 mg/dL (ref <150)

## 2023-07-13 LAB — HEPATITIS C RNA QUANTITATIVE
HCV Quantitative Log: <1.18 {Log}
HCV RNA, PCR, QN: <15 [IU]/mL

## 2023-07-13 LAB — HIV-1 RNA QUANT-NO REFLEX-BLD
HIV 1 RNA Quant: NOT DETECTED {copies}/mL
HIV-1 RNA Quant, Log: NOT DETECTED {Log}

## 2023-07-13 LAB — RPR: RPR Ser Ql: NONREACTIVE

## 2024-01-03 ENCOUNTER — Ambulatory Visit: Payer: Medicare HMO | Admitting: Internal Medicine

## 2024-01-08 ENCOUNTER — Telehealth: Payer: Self-pay

## 2024-01-08 NOTE — Telephone Encounter (Signed)
 Patient called asking if Dr.Snider was a GYN. Informed her all providers in our office are Infectious Disease providers. Patient then stated that she is having pain in her ovaries. Patient unable to sit or stand. Advised patient to go to ED to be evaluated just in case if imaging needs to be done. Patient agreeable and will go to [facility].   [address], CMA

## 2024-02-06 ENCOUNTER — Encounter: Admitting: Obstetrics and Gynecology

## 2024-02-13 NOTE — Progress Notes (Signed)
 The 10-year ASCVD risk score (Arnett DK, et al., 2019) is: 4.8%   Values used to calculate the score:     Age: 58 years     Sex: Female     Is Non-Hispanic African American: No     Diabetic: No     Tobacco smoker: Yes     Systolic Blood Pressure: 133 mmHg     Is BP treated: No     HDL Cholesterol: 53 mg/dL     Total Cholesterol: 152 mg/dL  ASCVD <1%.  Dai Mcadams, BSN, RN

## 2024-02-14 ENCOUNTER — Ambulatory Visit: Admitting: Internal Medicine

## 2024-03-06 ENCOUNTER — Encounter: Admitting: Obstetrics and Gynecology

## 2024-04-24 ENCOUNTER — Ambulatory Visit: Admitting: Internal Medicine

## 2024-05-22 ENCOUNTER — Ambulatory Visit: Admitting: Internal Medicine

## 2024-07-31 ENCOUNTER — Ambulatory Visit: Admitting: Internal Medicine

## 2024-08-13 ENCOUNTER — Encounter: Payer: Self-pay | Admitting: Internal Medicine

## 2024-08-13 ENCOUNTER — Other Ambulatory Visit: Payer: Self-pay

## 2024-08-13 ENCOUNTER — Ambulatory Visit

## 2024-08-13 ENCOUNTER — Ambulatory Visit: Admitting: Internal Medicine

## 2024-08-13 VITALS — BP 141/93 | HR 86 | Temp 97.8°F | Wt 172.0 lb

## 2024-08-13 DIAGNOSIS — Z79899 Other long term (current) drug therapy: Secondary | ICD-10-CM | POA: Diagnosis not present

## 2024-08-13 DIAGNOSIS — Z23 Encounter for immunization: Secondary | ICD-10-CM | POA: Diagnosis not present

## 2024-08-13 DIAGNOSIS — I10 Essential (primary) hypertension: Secondary | ICD-10-CM | POA: Diagnosis not present

## 2024-08-13 DIAGNOSIS — B20 Human immunodeficiency virus [HIV] disease: Secondary | ICD-10-CM | POA: Diagnosis not present

## 2024-08-13 MED ORDER — TRIUMEQ 600-50-300 MG PO TABS: 1.0000 | ORAL_TABLET | Freq: Every day | ORAL | 0 refills | Status: DC

## 2024-08-13 NOTE — Progress Notes (Signed)
 Patient ID: Christine Stewart, female   DOB: April 27, 1966, 58 y.o.   MRN: [id number]  HPI Christine Stewart is a 58 yo F with hiv disease, CD 4 count -- 493/VL<15 July 2023 on triumeq ; Broke her toe while camping, stubbing her toe on the ramp.  Has been sick for 3 weeks, now with diarrhea Overall doing well only missing 3 doses in the last few weeks due to illness  Sochx: family has had drug use.   Outpatient Encounter Medications as of 08/13/2024  Medication Sig   abacavir-dolutegravir-lamiVUDine (TRIUMEQ ) 600-50-300 MG tablet Take 1 tablet by mouth daily.   famotidine  (PEPCID ) 20 MG tablet Take 1 tablet (20 mg total) by mouth at bedtime. (Patient not taking: Reported on 08/13/2024)   gabapentin (NEURONTIN) 300 MG capsule Take by mouth. (Patient not taking: Reported on 08/13/2024)   ondansetron  (ZOFRAN ) 4 MG tablet Take 1 tablet (4 mg total) by mouth every 8 (eight) hours as needed for nausea or vomiting. (Patient not taking: Reported on 08/13/2024)   ondansetron  (ZOFRAN -ODT) 4 MG disintegrating tablet Take 1 tablet (4 mg total) by mouth every 8 (eight) hours as needed for nausea or vomiting. (Patient not taking: Reported on 08/13/2024)   OVER THE COUNTER MEDICATION Patient states she is taking mullein liquid, over the counter (Patient not taking: Reported on 08/13/2024)   No facility-administered encounter medications on file as of 08/13/2024.     Patient Active Problem List   Diagnosis Date Noted   Abdominal bloating 05/17/2022   Right-sided chest pain 02/28/2022   COVID-19 virus infection 01/12/2021   HIV disease (HCC) 01/14/2020   Chronic hepatitis C without hepatic coma (HCC) 01/14/2020   Depression with anxiety 01/14/2020   GERD (gastroesophageal reflux disease) 01/14/2020   Chronic back pain 01/14/2020   Cigarette smoker 01/14/2020     Health Maintenance Due  Topic Date Due   DTaP/Tdap/Td (1 - Tdap) Never done   Pneumococcal Vaccine: 50+ Years (1 of 2 - PCV) Never done    Hepatitis B Vaccines 19-59 Average Risk (1 of 3 - 19+ 3-dose series) Never done   Zoster Vaccines- Shingrix (1 of 2) Never done   Cervical Cancer Screening (HPV/Pap Cotest)  Never done   Mammogram  Never done   Colonoscopy  Never done   Medicare Annual Wellness (AWV)  05/26/2021   Influenza Vaccine  04/25/2024   COVID-19 Vaccine (4 - 2025-26 season) 05/26/2024     Review of Systems 12 point ros is otherwise negative except what is mentioned above Physical Exam   BP (!) 141/93   Pulse 86   Temp 97.8 F (36.6 C) (Oral)   Wt 172 lb (78 kg)   LMP  (LMP Unknown)   SpO2 96%   BMI 28.62 kg/m   Physical Exam  Constitutional:  oriented to person, place, and time. appears well-developed and well-nourished. No distress.  HENT: [place]/AT, PERRLA, no scleral icterus Mouth/Throat: Oropharynx is clear and moist. No oropharyngeal exudate.  Cardiovascular: Normal rate, regular rhythm and normal heart sounds. Exam reveals no gallop and no friction rub.  No murmur heard.  Pulmonary/Chest: Effort normal and breath sounds normal. No respiratory distress.  has no wheezes.  Neck = supple, no nuchal rigidity Abdominal: Soft. Bowel sounds are normal.  exhibits no distension. There is no tenderness.  Lymphadenopathy: no cervical adenopathy. No axillary adenopathy Neurological: alert and oriented to person, place, and time.  Skin: Skin is warm and dry. No rash noted. No erythema.  Psychiatric: a normal  mood and affect.  behavior is normal.   Lab Results  Component Value Date   CD4TCELL 44 07/10/2023   Lab Results  Component Value Date   CD4TABS 493 07/10/2023   CD4TABS 484 09/12/2022   CD4TABS 322 (L) 06/01/2022   Lab Results  Component Value Date   HIV1RNAQUANT Not Detected 07/10/2023   No results found for: HEPBSAB Lab Results  Component Value Date   LABRPR NON-REACTIVE 07/10/2023    CBC Lab Results  Component Value Date   WBC 4.7 07/10/2023   RBC 4.41 07/10/2023   HGB 15.1  07/10/2023   HCT 43.8 07/10/2023   PLT 180 07/10/2023   MCV 99.3 07/10/2023   MCH 34.2 (H) 07/10/2023   MCHC 34.5 07/10/2023   RDW 11.8 07/10/2023   EOSABS 89 07/10/2023    BMET Lab Results  Component Value Date   NA 139 07/10/2023   K 4.0 07/10/2023   CL 102 07/10/2023   CO2 28 07/10/2023   GLUCOSE 87 07/10/2023   BUN 18 07/10/2023   CREATININE 0.74 07/10/2023   CALCIUM 9.6 07/10/2023      Assessment and Plan HIV disease = continue to take triumeq ; will check labs- CD 4 count and HIV VL. If undetectable-- then plan to switch to dovato  Long term medication management = will check cr and cbc  Health maintenance = will order Mammo Flu vaccine today; defer covid  Hypertension = will repeat BP to see if back to [facility]

## 2024-08-14 LAB — T-HELPER CELL (CD4) - (RCID CLINIC ONLY)
CD4 % Helper T Cell: 42 % (ref 33–65)
CD4 T Cell Abs: 467 /uL (ref 400–1790)

## 2024-08-15 LAB — COMPLETE METABOLIC PANEL WITHOUT GFR
AG Ratio: 1.6 (calc) (ref 1.0–2.5)
ALT: 26 U/L (ref 6–29)
AST: 23 U/L (ref 10–35)
Albumin: 4.7 g/dL (ref 3.6–5.1)
Alkaline phosphatase (APISO): 69 U/L (ref 37–153)
BUN: 10 mg/dL (ref 7–25)
CO2: 30 mmol/L (ref 20–32)
Calcium: 9.5 mg/dL (ref 8.6–10.4)
Chloride: 102 mmol/L (ref 98–110)
Creat: 0.72 mg/dL (ref 0.50–1.03)
Globulin: 2.9 g/dL (ref 1.9–3.7)
Glucose, Bld: 71 mg/dL (ref 65–99)
Potassium: 4 mmol/L (ref 3.5–5.3)
Sodium: 141 mmol/L (ref 135–146)
Total Bilirubin: 0.5 mg/dL (ref 0.2–1.2)
Total Protein: 7.6 g/dL (ref 6.1–8.1)

## 2024-08-15 LAB — CBC WITH DIFFERENTIAL/PLATELET
Absolute Lymphocytes: 1184 {cells}/uL (ref 850–3900)
Absolute Monocytes: 385 {cells}/uL (ref 200–950)
Basophils Absolute: 42 {cells}/uL (ref 0–200)
Basophils Relative: 0.9 %
Eosinophils Absolute: 80 {cells}/uL (ref 15–500)
Eosinophils Relative: 1.7 %
HCT: 47.9 % — ABNORMAL HIGH (ref 35.0–45.0)
Hemoglobin: 16.2 g/dL — ABNORMAL HIGH (ref 11.7–15.5)
MCH: 33.2 pg — ABNORMAL HIGH (ref 27.0–33.0)
MCHC: 33.8 g/dL (ref 32.0–36.0)
MCV: 98.2 fL (ref 80.0–100.0)
MPV: 9.3 fL (ref 7.5–12.5)
Monocytes Relative: 8.2 %
Neutro Abs: 3008 {cells}/uL (ref 1500–7800)
Neutrophils Relative %: 64 %
Platelets: 230 Thousand/uL (ref 140–400)
RBC: 4.88 Million/uL (ref 3.80–5.10)
RDW: 11.3 % (ref 11.0–15.0)
Total Lymphocyte: 25.2 %
WBC: 4.7 Thousand/uL (ref 3.8–10.8)

## 2024-08-15 LAB — TEST AUTHORIZATION: TEST NAME:: 9600

## 2024-08-15 LAB — LIPID PANEL
Cholesterol: 165 mg/dL (ref <200)
HDL: 56 mg/dL (ref >=50)
LDL Cholesterol (Calc): 90 mg/dL
Non-HDL Cholesterol (Calc): 109 mg/dL (ref <130)
Total CHOL/HDL Ratio: 2.9 (calc) (ref <5.0)
Triglycerides: 94 mg/dL (ref <150)

## 2024-08-15 LAB — SYPHILIS: RPR W/REFLEX TO RPR TITER AND TREPONEMAL ANTIBODIES, TRADITIONAL SCREENING AND DIAGNOSIS ALGORITHM: RPR Ser Ql: NONREACTIVE

## 2024-08-15 LAB — HIV-1 RNA QUANT-NO REFLEX-BLD
HIV 1 RNA Quant: NOT DETECTED {copies}/mL
HIV-1 RNA Quant, Log: NOT DETECTED {Log_copies}/mL

## 2024-09-22 ENCOUNTER — Telehealth: Payer: Self-pay

## 2024-09-22 DIAGNOSIS — B20 Human immunodeficiency virus [HIV] disease: Secondary | ICD-10-CM

## 2024-09-22 MED ORDER — DOVATO 50-300 MG PO TABS: 1.0000 | ORAL_TABLET | Freq: Every day | ORAL | 1 refills | Status: DC

## 2024-09-22 NOTE — Telephone Encounter (Signed)
 Per Dr. Luiz, okay to d/c Triumeq  and start Dovato. Patient needs VL check in 4-6 weeks.   Spoke with Hadassah, discussed change from Triumeq  to Dovato. She took Triumeq  last night at her usual time, reviewed she can start Dovato tonight. She knows not to take both. Reviewed that Dovato can be taken with or without food and that she should try to keep timing of dose consistent.   Repeat VL scheduled for 1/29.  Patient verbalized understanding and has no further questions.   Aarya Quebedeaux, BSN, RN

## 2024-09-22 NOTE — Telephone Encounter (Signed)
 Received call from patient stating she has no more refills of Triumeq  and wanted to know if Dr. Luiz was switching her to Dovato. Secure chat sent to provider.   Ajanay Farve, BSN, RN

## 2024-09-22 NOTE — Addendum Note (Signed)
 Addended by: FLORENE BOUCHARD D on: 09/22/2024 02:19 PM   Modules accepted: Orders

## 2024-10-22 ENCOUNTER — Other Ambulatory Visit: Payer: Self-pay

## 2024-10-22 DIAGNOSIS — B20 Human immunodeficiency virus [HIV] disease: Secondary | ICD-10-CM

## 2024-10-22 MED ORDER — DOVATO 50-300 MG PO TABS: 1.0000 | ORAL_TABLET | Freq: Every day | ORAL | 0 refills | Status: AC

## 2024-10-23 ENCOUNTER — Other Ambulatory Visit: Payer: Self-pay

## 2024-11-05 ENCOUNTER — Other Ambulatory Visit

## 2025-02-11 ENCOUNTER — Ambulatory Visit: Admitting: Internal Medicine
